# Patient Record
Sex: Male | Born: 1955 | Race: White | Hispanic: No | Marital: Single | Smoking: Former smoker
Health system: Southern US, Community
[De-identification: ages and names within clinical notes are randomized; demographics above are authoritative.]

## PROBLEM LIST (undated history)

## (undated) DIAGNOSIS — E785 Hyperlipidemia, unspecified: Secondary | ICD-10-CM

## (undated) DIAGNOSIS — J9819 Other pulmonary collapse: Secondary | ICD-10-CM

## (undated) DIAGNOSIS — I2510 Atherosclerotic heart disease of native coronary artery without angina pectoris: Secondary | ICD-10-CM

## (undated) DIAGNOSIS — K5790 Diverticulosis of intestine, part unspecified, without perforation or abscess without bleeding: Secondary | ICD-10-CM

## (undated) DIAGNOSIS — M549 Dorsalgia, unspecified: Secondary | ICD-10-CM

## (undated) DIAGNOSIS — I10 Essential (primary) hypertension: Secondary | ICD-10-CM

## (undated) DIAGNOSIS — F32A Depression, unspecified: Secondary | ICD-10-CM

## (undated) DIAGNOSIS — G8929 Other chronic pain: Secondary | ICD-10-CM

## (undated) DIAGNOSIS — J449 Chronic obstructive pulmonary disease, unspecified: Secondary | ICD-10-CM

## (undated) DIAGNOSIS — F329 Major depressive disorder, single episode, unspecified: Secondary | ICD-10-CM

## (undated) HISTORY — DX: Hyperlipidemia, unspecified: E78.5

## (undated) HISTORY — PX: OTHER SURGICAL HISTORY: SHX169

## (undated) HISTORY — DX: Other chronic pain: G89.29

## (undated) HISTORY — DX: Depression, unspecified: F32.A

## (undated) HISTORY — DX: Essential (primary) hypertension: I10

## (undated) HISTORY — DX: Dorsalgia, unspecified: M54.9

## (undated) HISTORY — PX: CARDIAC CATHETERIZATION: SHX172

## (undated) HISTORY — PX: CORONARY STENT PLACEMENT: SHX1402

## (undated) HISTORY — DX: Other pulmonary collapse: J98.19

## (undated) HISTORY — DX: Major depressive disorder, single episode, unspecified: F32.9

## (undated) HISTORY — DX: Chronic obstructive pulmonary disease, unspecified: J44.9

## (undated) HISTORY — DX: Atherosclerotic heart disease of native coronary artery without angina pectoris: I25.10

---

## 1997-09-14 ENCOUNTER — Emergency Department (HOSPITAL_COMMUNITY): Admission: EM | Admit: 1997-09-14 | Discharge: 1997-09-14 | Payer: Self-pay | Admitting: Internal Medicine

## 2004-01-08 ENCOUNTER — Ambulatory Visit (HOSPITAL_COMMUNITY): Admission: RE | Admit: 2004-01-08 | Discharge: 2004-01-08 | Payer: Self-pay | Admitting: Occupational Therapy

## 2004-02-20 ENCOUNTER — Ambulatory Visit (HOSPITAL_COMMUNITY): Admission: RE | Admit: 2004-02-20 | Discharge: 2004-02-20 | Payer: Self-pay | Admitting: Occupational Therapy

## 2005-06-09 ENCOUNTER — Emergency Department (HOSPITAL_COMMUNITY): Admission: EM | Admit: 2005-06-09 | Discharge: 2005-06-09 | Payer: Self-pay | Admitting: Emergency Medicine

## 2006-08-06 ENCOUNTER — Emergency Department (HOSPITAL_COMMUNITY): Admission: EM | Admit: 2006-08-06 | Discharge: 2006-08-06 | Payer: Self-pay | Admitting: Emergency Medicine

## 2007-03-09 ENCOUNTER — Ambulatory Visit: Payer: Self-pay | Admitting: Cardiology

## 2007-03-10 ENCOUNTER — Ambulatory Visit: Payer: Self-pay | Admitting: Cardiology

## 2007-03-15 ENCOUNTER — Ambulatory Visit: Payer: Self-pay | Admitting: Internal Medicine

## 2007-03-15 ENCOUNTER — Inpatient Hospital Stay (HOSPITAL_BASED_OUTPATIENT_CLINIC_OR_DEPARTMENT_OTHER): Admission: RE | Admit: 2007-03-15 | Discharge: 2007-03-15 | Payer: Self-pay | Admitting: Internal Medicine

## 2007-03-24 ENCOUNTER — Ambulatory Visit: Payer: Self-pay | Admitting: Cardiology

## 2007-10-27 ENCOUNTER — Ambulatory Visit: Payer: Self-pay | Admitting: Internal Medicine

## 2008-05-29 ENCOUNTER — Encounter: Admission: RE | Admit: 2008-05-29 | Discharge: 2008-05-29 | Payer: Self-pay | Admitting: Internal Medicine

## 2010-02-03 ENCOUNTER — Ambulatory Visit: Payer: Self-pay | Admitting: Physician Assistant

## 2010-02-03 ENCOUNTER — Telehealth: Payer: Self-pay | Admitting: Physician Assistant

## 2010-02-03 ENCOUNTER — Encounter: Payer: Self-pay | Admitting: Physician Assistant

## 2010-02-03 DIAGNOSIS — E785 Hyperlipidemia, unspecified: Secondary | ICD-10-CM

## 2010-02-03 DIAGNOSIS — F17200 Nicotine dependence, unspecified, uncomplicated: Secondary | ICD-10-CM

## 2010-02-03 DIAGNOSIS — J449 Chronic obstructive pulmonary disease, unspecified: Secondary | ICD-10-CM

## 2010-02-03 DIAGNOSIS — I10 Essential (primary) hypertension: Secondary | ICD-10-CM

## 2010-02-03 DIAGNOSIS — I2510 Atherosclerotic heart disease of native coronary artery without angina pectoris: Secondary | ICD-10-CM | POA: Insufficient documentation

## 2010-02-03 DIAGNOSIS — R079 Chest pain, unspecified: Secondary | ICD-10-CM | POA: Insufficient documentation

## 2010-02-03 DIAGNOSIS — J4489 Other specified chronic obstructive pulmonary disease: Secondary | ICD-10-CM | POA: Insufficient documentation

## 2010-02-12 ENCOUNTER — Telehealth (INDEPENDENT_AMBULATORY_CARE_PROVIDER_SITE_OTHER): Payer: Self-pay | Admitting: Radiology

## 2010-02-13 ENCOUNTER — Encounter: Payer: Self-pay | Admitting: Cardiology

## 2010-02-13 ENCOUNTER — Ambulatory Visit: Payer: Self-pay | Admitting: Cardiology

## 2010-02-13 ENCOUNTER — Ambulatory Visit: Payer: Self-pay

## 2010-02-13 ENCOUNTER — Encounter (HOSPITAL_COMMUNITY)
Admission: RE | Admit: 2010-02-13 | Discharge: 2010-04-05 | Payer: Self-pay | Source: Home / Self Care | Attending: Internal Medicine | Admitting: Internal Medicine

## 2010-02-20 ENCOUNTER — Encounter: Payer: Self-pay | Admitting: Internal Medicine

## 2010-02-24 ENCOUNTER — Ambulatory Visit: Payer: Self-pay | Admitting: Internal Medicine

## 2010-03-03 ENCOUNTER — Ambulatory Visit: Payer: Self-pay | Admitting: Internal Medicine

## 2010-03-03 ENCOUNTER — Inpatient Hospital Stay (HOSPITAL_BASED_OUTPATIENT_CLINIC_OR_DEPARTMENT_OTHER): Admission: RE | Admit: 2010-03-03 | Discharge: 2010-03-03 | Payer: Self-pay | Admitting: Internal Medicine

## 2010-03-04 ENCOUNTER — Encounter: Payer: Self-pay | Admitting: Physician Assistant

## 2010-03-04 ENCOUNTER — Telehealth: Payer: Self-pay | Admitting: Cardiology

## 2010-03-05 ENCOUNTER — Ambulatory Visit (HOSPITAL_COMMUNITY)
Admission: RE | Admit: 2010-03-05 | Discharge: 2010-03-06 | Payer: Self-pay | Source: Home / Self Care | Admitting: Internal Medicine

## 2010-03-05 ENCOUNTER — Ambulatory Visit: Payer: Self-pay | Admitting: Cardiology

## 2010-03-05 LAB — CONVERTED CEMR LAB
BUN: 10 mg/dL (ref 6–23)
Basophils Absolute: 0.1 10*3/uL (ref 0.0–0.1)
Basophils Relative: 0.5 % (ref 0.0–3.0)
CO2: 25 meq/L (ref 19–32)
Calcium: 9.3 mg/dL (ref 8.4–10.5)
Chloride: 104 meq/L (ref 96–112)
Creatinine, Ser: 0.8 mg/dL (ref 0.4–1.5)
Eosinophils Absolute: 0.4 10*3/uL (ref 0.0–0.7)
Eosinophils Relative: 3.5 % (ref 0.0–5.0)
GFR calc non Af Amer: 104.39 mL/min (ref >60)
Glucose, Bld: 104 mg/dL — ABNORMAL HIGH (ref 70–99)
HCT: 47.5 % (ref 39.0–52.0)
Hemoglobin: 16.4 g/dL (ref 13.0–17.0)
INR: 1 (ref 0.8–1.0)
Lymphocytes Relative: 30.1 % (ref 12.0–46.0)
Lymphs Abs: 3.2 10*3/uL (ref 0.7–4.0)
MCHC: 34.5 g/dL (ref 30.0–36.0)
MCV: 90.7 fL (ref 78.0–100.0)
Monocytes Absolute: 0.6 10*3/uL (ref 0.1–1.0)
Monocytes Relative: 5.9 % (ref 3.0–12.0)
Neutro Abs: 6.3 10*3/uL (ref 1.4–7.7)
Neutrophils Relative %: 60 % (ref 43.0–77.0)
Platelets: 270 10*3/uL (ref 150.0–400.0)
Potassium: 4.4 meq/L (ref 3.5–5.1)
Prothrombin Time: 10.3 s (ref 9.7–11.8)
RBC: 5.24 M/uL (ref 4.22–5.81)
RDW: 14.7 % — ABNORMAL HIGH (ref 11.5–14.6)
Sodium: 140 meq/L (ref 135–145)
WBC: 10.5 10*3/uL (ref 4.5–10.5)

## 2010-03-20 ENCOUNTER — Ambulatory Visit: Payer: Self-pay

## 2010-03-20 ENCOUNTER — Encounter: Payer: Self-pay | Admitting: Physician Assistant

## 2010-04-22 ENCOUNTER — Encounter: Payer: Self-pay | Admitting: Physician Assistant

## 2010-04-22 ENCOUNTER — Ambulatory Visit
Admission: RE | Admit: 2010-04-22 | Discharge: 2010-04-22 | Payer: Self-pay | Source: Home / Self Care | Attending: Physician Assistant | Admitting: Physician Assistant

## 2010-04-22 ENCOUNTER — Other Ambulatory Visit: Payer: Self-pay | Admitting: Physician Assistant

## 2010-04-22 DIAGNOSIS — R109 Unspecified abdominal pain: Secondary | ICD-10-CM | POA: Insufficient documentation

## 2010-04-23 LAB — CBC WITH DIFFERENTIAL/PLATELET
Basophils Absolute: 0.1 10*3/uL (ref 0.0–0.1)
Basophils Relative: 0.6 % (ref 0.0–3.0)
Eosinophils Absolute: 0.3 10*3/uL (ref 0.0–0.7)
Eosinophils Relative: 3.3 % (ref 0.0–5.0)
HCT: 47.3 % (ref 39.0–52.0)
Hemoglobin: 16.3 g/dL (ref 13.0–17.0)
Lymphocytes Relative: 33.8 % (ref 12.0–46.0)
Lymphs Abs: 3.4 10*3/uL (ref 0.7–4.0)
MCHC: 34.4 g/dL (ref 30.0–36.0)
MCV: 91 fl (ref 78.0–100.0)
Monocytes Absolute: 0.5 10*3/uL (ref 0.1–1.0)
Monocytes Relative: 4.8 % (ref 3.0–12.0)
Neutro Abs: 5.8 10*3/uL (ref 1.4–7.7)
Neutrophils Relative %: 57.5 % (ref 43.0–77.0)
Platelets: 278 10*3/uL (ref 150.0–400.0)
RBC: 5.2 Mil/uL (ref 4.22–5.81)
RDW: 14.1 % (ref 11.5–14.6)
WBC: 10.1 10*3/uL (ref 4.5–10.5)

## 2010-04-23 LAB — BASIC METABOLIC PANEL
BUN: 10 mg/dL (ref 6–23)
CO2: 27 mEq/L (ref 19–32)
Calcium: 9.5 mg/dL (ref 8.4–10.5)
Chloride: 104 mEq/L (ref 96–112)
Creatinine, Ser: 0.8 mg/dL (ref 0.4–1.5)
GFR: 113.89 mL/min (ref >60.00)
Glucose, Bld: 87 mg/dL (ref 70–99)
Potassium: 4.3 mEq/L (ref 3.5–5.1)
Sodium: 139 mEq/L (ref 135–145)

## 2010-05-06 NOTE — Assessment & Plan Note (Signed)
Summary: Cardiology Nuclear Testing  Nuclear Med Background Indications for Stress Test: Evaluation for Ischemia   History: COPD, Heart Catheterization   Symptoms: Chest Pressure, Chest Pressure with Exertion, Diaphoresis, Dizziness, DOE, Fatigue, Nausea, SOB  Symptoms Comments: CP>jaw. Last episode of CP:one month ago.   Nuclear Pre-Procedure Cardiac Risk Factors: Family History - CAD, Hypertension, Lipids, Obesity, Smoker Caffeine/Decaff Intake: None NPO After: 8:00 PM Lungs: Inspiratory and expiratory wheezes throughout, O2 Sat 96% on RA.  ProAir inhaler used prior to infusion. IV 0.9% NS with Angio Cath: 20g     IV Site: L Wrist IV Started by: Stanton Kidney, EMT-P Chest Size (in) 50     Height (in): 66 Weight (lb): 209 BMI: 33.86  Nuclear Med Study 1 or 2 day study:  1 day     Stress Test Type:  Dobutamine Reading MD:  Olga Millers, MD     Referring MD:  Arvilla Meres, MD Resting Radionuclide:  Technetium 97m Tetrofosmin     Resting Radionuclide Dose:  11 mCi  Stress Radionuclide:  Technetium 50m Tetrofosmin     Stress Radionuclide Dose:  33 mCi   Stress Protocol Exercise Time (min):  13:00 min     Max HR:  148 bpm     Predicted Max HR:  167 bpm  Max Systolic BP: 168 mm Hg     Percent Max HR:  88.62 %Rate Pressure Product:  16109   Dose of Dobutamine:  40 mcg/kg/min (at max HR)  Stress Test Technologist:  Rea [employer], CMA-N     Nuclear Technologist:  Doyne Keel, CNMT  Rest Procedure  Myocardial perfusion imaging was performed at rest 45 minutes following the intravenous administration of Technetium 3m Tetrofosmin.  Stress Procedure  The patient received IV dobutamine and no IV atropine. There were no significant changes with infusion, other than frequent PVC's with occasional couplets. He did c/o chest tightness with infusion.  Technetium 22m Tetrofosmin was injected at peak heart rate and quantitative spect images were obtained after a 45 minute  delay.  QPS Raw Data Images:  Acquisition technically good; normal left ventricular size. Stress Images:  There is decreased uptake in the inferior wall Rest Images:  There is decreased uptake in the inferior wall, less prominent compared to the stress images. 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 Transient Ischemic Dilatation:  1.11  (Normal <1.22)  Lung/Heart Ratio:  0.40  (Normal <0.45)  Quantitative Gated Spect Images QGS EDV:  95 ml QGS ESV:  43 ml QGS EF:  55 % QGS cine images:  Normal wall motion.   Overall Impression  Exercise Capacity: Dobutamine study with no exercise. BP Response: Normal blood pressure response. Clinical Symptoms: There is chest pain ECG Impression: No significant ST segment change suggestive of ischemia. 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  Appended Document: Cardiology Nuclear Testing Lorin Picket - this may be diaphragmatic attenuation. lets review images next week to decide if cath is warranted. -dan  Appended Document: Cardiology Nuclear Testing Dr Gala Romney reviewed images, pt needs cath, have called pt and Left message to call back   Appended Document: Cardiology Nuclear Testing pt is aware of results, he is checking on transportation and will call me back to sch cath, gave pt 2 day to choose from 11/23 or 11/28  Appended Document: Cardiology Nuclear Testing spoke w/pt cath sch for 11/28 at 9:30, instructions reviewed w/him  over phone, he will come for labs on Mon 11/21   Clinical Lists Changes  Orders: Added new Referral order of Cardiac Catheterization (Cardiac Cath) - Signed

## 2010-05-06 NOTE — Progress Notes (Signed)
Summary: needs order   Phone Note From Other Clinic   Caller: cone Summary of Call: pt having intervascular [place] tomorrow with brodie-they need orders fax [phone] Initial call taken by: Glynda Jaeger,  March 04, 2010 1:39 PM  Follow-up for Phone Call        I spoke with Trish since Dr. Gala Romney scheduled this at d/c from the [facility] on 11/28. Per Otis Dials should be writing the orders since this was set up from the [facility].  Follow-up by: Sherri Rad, RN, BSN,  March 04, 2010 1:59 PM
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
## 2010-05-06 NOTE — Letter (Signed)
Summary: Cardiac Catheterization Instructions- [facility]  [facility], Main Office  [address]   [place], [place] [address]   Phone: [phone]  Fax: [phone]     02/20/2010 MRN: [id number]  [facility] [address] [place], [place]  [address]  Dear Mr. Medeiros,   You are scheduled for a Cardiac Catheterization on Monday 03/03/10 with Dr. Gala Romney  Please arrive to the 1st floor of the Heart and Vascular Center at [facility] at 8:30 am / pm on the day of your procedure. Please do not arrive before 6:30 a.m. Call the Heart and Vascular Center at [phone] if you are unable to make your appointmnet. The Code to get into the parking garage under the building is 2000. Take the elevators to the 1st floor. You must have someone to drive you home. Someone must be with you for the first 24 hours after you arrive home. Please wear clothes that are easy to get on and off and wear slip-on shoes. Do not eat or drink after midnight except water with your medications that morning. Bring all your medications and current insurance cards with you.  ___ DO NOT take these medications before your procedure: ________________________________________________________________  _X__ Make sure you take your aspirin.  _X__ You may take ALL of your medications with water that morning. ________________________________________________________________________________________________________________________________  ___ DO NOT take ANY medications before your procedure.  ___ Pre-med instructions:  ________________________________________________________________________________________________________________________________  The usual length of stay after your procedure is 2 to 3 hours. This can vary.  If you have any questions, please call the office at the number listed above.   Meredith Staggers, RN

## 2010-05-06 NOTE — Progress Notes (Signed)
   Phone Note Outgoing Call   Summary of Call: [place]  Please call the patient and tell him to stop the Diltiazem. I noticed he is on this as well.  This medicine is the same as amlodipine.  He does not need both.  I want him to stay on the Amlodipine.  Initial call taken by: Brynda Rim,  February 03, 2010 5:06 PM  Follow-up for Phone Call        [facility]. Ollen Gross, RN, BSN  February 04, 2010 8:12 AM Pt. called back. He is aware to stop take Diltiazem 30 mg. Pt. verbalized understanding Follow-up by: Ollen Gross, RN, BSN,  February 04, 2010 8:22 AM

## 2010-05-06 NOTE — Miscellaneous (Signed)
  Clinical Lists Changes  Observations: Added new observation of CARDCATHFIND: ASSESSMENT: 1. Coronary artery disease with borderline lesion in the mid-to-distal     AV groove left circumflex. 2. Normal left ventricular function.   PLAN/DISCUSSION:  This is a difficult situation in looking at his coronary anatomy, his lesion in the left circumflex does not appear overly tight; however, it is mildly hazy.  Given his symptoms and positive stress test, I think he warrants further evaluation.  I have discussed with Dr. Juanda Chance.  We will plan to bring him back for an intervascular ultrasound on Wednesday.  We will start him on Plavix.   (03/03/2010 10:16) Added new observation of NUCLEAR NOS: Exercise Capacity: Dobutamine study with no exercise. BP Response: Normal blood pressure response. Clinical Symptoms: There is chest pain ECG Impression: No significant ST segment change suggestive of ischemia. 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 (02/13/2010 10:16)      Cardiac Cath  Procedure date:  03/03/2010  Findings:      ASSESSMENT: 1. Coronary artery disease with borderline lesion in the mid-to-distal     AV groove left circumflex. 2. Normal left ventricular function.   PLAN/DISCUSSION:  This is a difficult situation in looking at his coronary anatomy, his lesion in the left circumflex does not appear overly tight; however, it is mildly hazy.  Given his symptoms and positive stress test, I think he warrants further evaluation.  I have discussed with Dr. Juanda Chance.  We will plan to bring him back for an intervascular ultrasound on Wednesday.  We will start him on Plavix.    Nuclear Study  Procedure date:  02/13/2010  Findings:      Exercise Capacity: Dobutamine study with no exercise. BP Response: Normal blood pressure response. Clinical Symptoms: There is chest pain ECG Impression: No  significant ST segment change suggestive of ischemia. 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

## 2010-05-06 NOTE — Progress Notes (Signed)
Summary: nuc pre-procedure  Phone Note Outgoing Call   Call placed by: Domenic Polite, CNMT,  February 12, 2010 10:02 AM Call placed to: Patient Reason for Call: Confirm/change Appt Summary of Call: Reviewed information on Myoview Information Sheet (see scanned document for further details).  Spoke with patient.  Initial call taken by: Domenic Polite, CNMT,  February 12, 2010 10:02 AM     Nuclear Med Background Indications for Stress Test: Evaluation for Ischemia   History: COPD, Heart Catheterization   Symptoms: Chest Pain, Chest Pressure with Exertion, Diaphoresis, DOE, Nausea  Symptoms Comments: CP>>> Leg/Arm   Nuclear Pre-Procedure Cardiac Risk Factors: Family History - CAD, Hypertension, Lipids, Smoker Height (in): 66

## 2010-05-08 NOTE — Miscellaneous (Signed)
  Clinical Lists Changes  Observations: Added new observation of CARDCATHFIND: CONCLUSION:  Successful intracoronary vascular ultrasound-guided percutaneous coronary intervention of the lesion in the distal circumflex artery with improvement in central narrowing from 80% to 0%.   DISPOSITION:  The patient returned to post angio room for further observation.  We will work on smoking cessation.   (03/05/2010 8:02)      Cardiac Cath  Procedure date:  03/05/2010  Findings:      CONCLUSION:  Successful intracoronary vascular ultrasound-guided percutaneous coronary intervention of the lesion in the distal circumflex artery with improvement in central narrowing from 80% to 0%.   DISPOSITION:  The patient returned to post angio room for further observation.  We will work on smoking cessation.

## 2010-05-08 NOTE — Assessment & Plan Note (Addendum)
Summary: eph.gd  Medications Added ASPIRIN 325 MG TABS (ASPIRIN) 1 once daily MELOXICAM 15 MG TABS (MELOXICAM) AS NEEDED PLAVIX 75 MG TABS (CLOPIDOGREL BISULFATE) 1 once daily PEPCID 20 MG TABS (FAMOTIDINE) Take 1 tablet by mouth two times a day        Visit Type:  Follow-up Primary Provider:  Dr Selena Batten  CC:  PHOSP.  History of Present Illness: Primary Cardiologist:  Dr. Arvilla Meres   [name] [name] is a 55 year old male with a history of tobacco abuse as well as hypertension and hyperlipidemia.  Cardiac catheterization by Dr. Gala Romney in December 2008 showed mild nonobstructive coronary artery disease with an EF of 60%.  I saw him recently for chest discomfort.  A dobutamine Myoview study was abnormal and he was set up for cardiac catheterization.  This was done on November 28 and demonstrated a mid 60-70% circumflex lesion.  He was brought back to the cardiac catheterization lab for IVUS guided intervention.  He had drug-eluting stent placed to the circumflex.  He tolerated the procedure well and was to followup several weeks ago.  He is just returning now for followup.  The patient states that he was admitted to [facility] mid-December for abdominal pain, nausea and vomiting.  He was not really placed on any new medications or had any tests.  He has not yet seen his primary care provider.  Since he was placed on Plavix he has had increased abdominal discomfort and indigestion symptoms.  He denies hematemesis.  He denies melena.  He has had some loose stools.  He denies any further chest discomfort.  He has chronic dyspnea with exertion.  This is related to his COPD. This is unchanged.  He denies orthopnea or syncope.  Current Medications (verified): 1)  Amlodipine Besylate 10 Mg Tabs (Amlodipine Besylate) .... Take One Tablet By Mouth Daily 2)  Lisinopril 40 Mg Tabs (Lisinopril) .... Take One Tablet By Mouth Daily 3)  Diltiazem Hcl 30 Mg Tabs (Diltiazem Hcl) .... Take 1  Tablet By Mouth Once A Day 4)  Proair Hfa 108 (90 Base) Mcg/act Aers (Albuterol Sulfate) .... As Needed 5)  Cyclobenzaprine Hcl 10 Mg Tabs (Cyclobenzaprine Hcl) .... As Needed 6)  Aspirin 325 Mg Tabs (Aspirin) .Marland Kitchen.. 1 Once Daily 7)  Pristiq 100 Mg Xr24h-Tab (Desvenlafaxine Succinate) .... Once Daily 8)  Simvastatin 20 Mg Tabs (Simvastatin) .... Take One Tablet By Mouth Daily At Bedtime 9)  Nitrostat 0.4 Mg Subl (Nitroglycerin) .Marland Kitchen.. 1 Tablet Under Tongue At Onset of Chest Pain; You May Repeat Every 5 Minutes For Up To 3 Doses. 10)  Spiriva Handihaler 18 Mcg Caps (Tiotropium Bromide Monohydrate) .... Once Daily 11)  Meloxicam 15 Mg Tabs (Meloxicam) .... As Needed 12)  Plavix 75 Mg Tabs (Clopidogrel Bisulfate) .Marland Kitchen.. 1 Once Daily  Allergies: No Known Drug Allergies  Past History:  Past Medical History: Last updated: 03/07/2010 CAD    a.  cath 2008:  LAD 40% mid and ostial D2 50%; CFx and RCA ok; EF 60%   b.  abnl. myoview . . .cath 03/03/2010:  mLAD 30%; pCFX 20%; mid-distal CFX 70-80%   c.  IVUS guided PCI of mid-distal CFX 03/03/2010 (Promus DES) Hypertension COPD Hyperlipidemia Chronic Back Pain Depression  Review of Systems       As per  the HPI.  All other systems reviewed and negative.  Vital Signs:  Patient profile:   55 year old male Height:      66 inches Weight:  210.25 pounds BMI:     34.06 Pulse rate:   82 / minute Pulse rhythm:   regular BP sitting:   110 / 68  (left arm) Cuff size:   regular  Vitals Entered By: Scherrie Bateman, LPN (April 22, 2010 3:28 PM)  Physical Exam  General:  Well nourished, well developed, in no acute distress HEENT: normal Neck: no JVD Cardiac:  normal S1, S2; RRR; no murmur Lungs:  decreased breath sounds bilat with exp wheezing in all fields Abd: soft, nontender, no hepatomegaly Ext: no  edema; RFA site without hematoma or bruit Vascular: no carotid  bruits Skin: warm and dry Endo: no thyromegaly   EKG  Procedure  date:  04/22/2010  Findings:      normal sinus rhythm Heart rate 82 Normal axis Low-voltage Poor R-wave progression Nonspecific ST-T wave changes  Impression & Recommendations:  Problem # 1:  CAD (ICD-414.00) No further anginal symptoms.  Continue on aspirin and Plavix.  Please see below.  Orders: EKG w/ Interpretation & report only (93010)  Problem # 2:  HYPERLIPIDEMIA (ICD-272.4) This is followed by his PCP.  His goal LDL is less than 70. His updated medication list for this problem includes:    Simvastatin 20 Mg Tabs (Simvastatin) .Marland Kitchen... Take one tablet by mouth daily at bedtime  Problem # 3:  ESSENTIAL HYPERTENSION, BENIGN (ICD-401.1) Controlled.  I am uncertain as to why he is on diltiazem 30 mg a day.  With the use of amlodipine and simvastatin, I have asked him to go ahead and stop this. Orders: TLB-BMP (Basic Metabolic Panel-BMET) (80048-METABOL) TLB-CBC Platelet - w/Differential (85025-CBCD)  Problem # 4:  COPD (ICD-496) I have spent less than 5 minutes counseling him on smoking cessation today.  I've asked him to follow up with his primary care provider for strategies.  He should also call [facility] (he lives in [place]) to see if they have any classes that he can attend.  Problem # 5:  ABDOMINAL PAIN OTHER SPECIFIED SITE (ICD-789.09) I think he may be experiencing some GI upset with the addition of Plavix.  His aspirin will be decreased to 81 mg a day.  He needs to remain on Plavix for at least a year.  I have placed him on Pepcid 20 mg twice a day.  I will check a CBC and basic metabolic panel today.  He should follow up wis primary care physician for further evaluation if needed.  Patient Instructions: 1)  Your physician has recommended you make the following change in your medication:  2)  STOP taking Diltiazem. 3)  Decrease ASA to 81 mg once daily. 4)  Start taking Pepcid 20 mg two times a day for your stomach. 5)  Your physician recommends  that you schedule a follow-up appointment in: 2 MONTHS WITH DR BENSIMHON 6)  FOLLOW UP WITH PRIMARY MD FOR COPD AND STOMACH Prescriptions: PEPCID 20 MG TABS (FAMOTIDINE) Take 1 tablet by mouth two times a day  #60 x 3   Entered and Authorized by:   Tereso Newcomer PA-C   Signed by:   Tereso Newcomer PA-C on 04/22/2010   Method used:   Print then Give to Patient   RxID:   [id number]

## 2010-05-08 NOTE — Cardiovascular Report (Signed)
Summary: Pre Cath Orders   Pre Cath Orders   Imported By: Roderic Ovens 03/14/2010 14:47:31  _____________________________________________________________________  External Attachment:    Type:   Image     Comment:   External Document

## 2010-05-15 ENCOUNTER — Telehealth: Payer: Self-pay | Admitting: Internal Medicine

## 2010-05-22 ENCOUNTER — Telehealth: Payer: Self-pay | Admitting: Internal Medicine

## 2010-05-22 NOTE — Progress Notes (Signed)
Summary: medication question   Phone Note Call from Patient Call back at Home Phone [phone]   Caller: Patient Summary of Call: Pt have question about meidcation Symbicord  Initial call taken by: Judie Grieve,  May 15, 2010 11:44 AM  Follow-up for Phone Call        Pt pcp prescribed symbicort and pt has read adverse reactions. Also needed 2 month appt with Dr. Gala Romney -- Appt. made for 3/5/12He wants to make sure it is okay with Dr. Gala Romney to take this medication.  I will forward to Dr. Gala Romney for review. Follow-up by: Lisabeth Devoid RN,  May 15, 2010 12:00 PM     Appended Document: medication question ok from cardiac standpoint.   Appended Document: medication question Left message to call back  Appended Document: medication question   lm to cb pt aware

## 2010-06-03 NOTE — Progress Notes (Signed)
Summary: pt has knot he is concerned   Phone Note Call from Patient Call back at Home Phone [phone]   Caller: Patient Reason for Call: Talk to Nurse, Talk to Doctor Summary of Call: per pt call on his left side at the bottom of his rib cage he has a egg shaped knot and it is sore and bothering him it has been about a month and he is concerned  Initial call taken by: Omer Jack,  May 22, 2010 12:36 PM  Follow-up for Phone Call        left message for pt to call back if he feels like this is related to something cardiac however sounds as if this needs to be evaluated by his primary care MD. Follow-up by: Charolotte Capuchin, RN,  May 22, 2010 2:47 PM  Additional Follow-up for Phone Call Additional follow up Details #1::        pt has never called back, left mess make sure pt had f/u w/pcp if he needed [place] to give [place] a call back Meredith Staggers, RN  May 26, 2010 3:29 PM

## 2010-06-09 ENCOUNTER — Encounter: Payer: Self-pay | Admitting: Internal Medicine

## 2010-06-09 ENCOUNTER — Ambulatory Visit (INDEPENDENT_AMBULATORY_CARE_PROVIDER_SITE_OTHER): Payer: Medicare Other | Admitting: Internal Medicine

## 2010-06-09 DIAGNOSIS — J4489 Other specified chronic obstructive pulmonary disease: Secondary | ICD-10-CM

## 2010-06-09 DIAGNOSIS — J449 Chronic obstructive pulmonary disease, unspecified: Secondary | ICD-10-CM

## 2010-06-09 DIAGNOSIS — I2510 Atherosclerotic heart disease of native coronary artery without angina pectoris: Secondary | ICD-10-CM

## 2010-06-17 LAB — CBC
HCT: 42 % (ref 39.0–52.0)
Hemoglobin: 14 g/dL (ref 13.0–17.0)
MCHC: 33.3 g/dL (ref 30.0–36.0)
MCV: 90.2 fL (ref 78.0–100.0)
Platelets: 236 10*3/uL (ref 150–400)
RBC: 4.67 MIL/uL (ref 4.22–5.81)
RBC: 5.32 MIL/uL (ref 4.22–5.81)
RDW: 14 % (ref 11.5–15.5)
WBC: 8.5 10*3/uL (ref 4.0–10.5)
WBC: 10.3 10*3/uL (ref 4.0–10.5)

## 2010-06-17 LAB — BASIC METABOLIC PANEL
BUN: 9 mg/dL (ref 6–23)
Calcium: 8.4 mg/dL (ref 8.4–10.5)
Calcium: 9.5 mg/dL (ref 8.4–10.5)
Chloride: 104 mEq/L (ref 96–112)
Creatinine, Ser: 0.73 mg/dL (ref 0.4–1.5)
GFR calc Af Amer: >60 mL/min (ref >60)
GFR calc Af Amer: >60 mL/min (ref >60)
GFR calc non Af Amer: >60 mL/min (ref >60)
Potassium: 3.8 mEq/L (ref 3.5–5.1)
Sodium: 136 mEq/L (ref 135–145)

## 2010-06-17 NOTE — Assessment & Plan Note (Signed)
Summary: follow-up/dfg  Medications Added ASPIRIN 81 MG TBEC (ASPIRIN) Take one tablet by mouth daily TRAMADOL HCL 50 MG TABS (TRAMADOL HCL) as needed ALBUTEROL SULFATE (2.5 MG/3ML) 0.083% NEBU (ALBUTEROL SULFATE) as needed      Allergies Added: NKDA  Visit Type:  Follow-up Primary Provider:  Dr Selena Batten  CC:  Chest pains- Sob.  History of Present Illness: [name] [name] is a 55 year old male with a history of COPD with ongoing tobacco abuse as well as hypertension and hyperlipidemia.  Cardiac catheterization  in December 2008 showed mild nonobstructive coronary artery disease with an EF of 60%. He was seen by [name] [name] recently for chest discomfort.  A dobutamine Myoview study was abnormal and he was set up for cardiac catheterization.  This was done on November 28 and demonstrated a mid 60-70% circumflex lesion.  He was brought back to the cardiac catheterization lab for IVUS guided intervention.  He had drug-eluting stent placed to the circumflex.  He tolerated the procedure well. He saw [name] [name] back in January and was doing well without angina.  Returns today for f/u.  Overall doing well. Tired frequently. About 2-3 weeks ago, went to [facility] region to have an u/s to evaluate knot in his left side. While there developed a dry mouth and then had some pleutiric CP and SOB. Went to ER. Got NTG and cardiac markers and ECG which were OK.   Has not had any CP since. On disability. Able to do ADLs. Still smoking 1ppd. Taking Plavix every day. Not going to cardiac rehab. Recently coughing and wheezing. Doesn't know if he snores or not.   Current Medications (verified): 1)  Amlodipine Besylate 10 Mg Tabs (Amlodipine Besylate) .... Take One Tablet By Mouth Daily 2)  Lisinopril 40 Mg Tabs (Lisinopril) .... Take One Tablet By Mouth Daily 3)  Proair Hfa 108 (90 Base) Mcg/act Aers (Albuterol Sulfate) .... As Needed 4)  Cyclobenzaprine Hcl 10 Mg Tabs (Cyclobenzaprine Hcl) .... As Needed 5)   Aspirin 325 Mg Tabs (Aspirin) .Marland Kitchen.. 1 Once Daily 6)  Pristiq 100 Mg Xr24h-Tab (Desvenlafaxine Succinate) .... Once Daily 7)  Simvastatin 20 Mg Tabs (Simvastatin) .... Take One Tablet By Mouth Daily At Bedtime 8)  Nitrostat 0.4 Mg Subl (Nitroglycerin) .Marland Kitchen.. 1 Tablet Under Tongue At Onset of Chest Pain; You May Repeat Every 5 Minutes For Up To 3 Doses. 9)  Spiriva Handihaler 18 Mcg Caps (Tiotropium Bromide Monohydrate) .... Once Daily 10)  Plavix 75 Mg Tabs (Clopidogrel Bisulfate) .Marland Kitchen.. 1 Once Daily 11)  Pepcid 20 Mg Tabs (Famotidine) .... Take 1 Tablet By Mouth Two Times A Day 12)  Aspirin 81 Mg Tbec (Aspirin) .... Take One Tablet By Mouth Daily 13)  Tramadol Hcl 50 Mg Tabs (Tramadol Hcl) .... As Needed 14)  Albuterol Sulfate (2.5 Mg/74ml) 0.083% Nebu (Albuterol Sulfate) .... As Needed  Allergies (verified): No Known Drug Allergies  Past History:  Past Medical History: Last updated: 03/07/2010 CAD    a.  cath 2008:  LAD 40% mid and ostial D2 50%; CFx and RCA ok; EF 60%   b.  abnl. myoview . . .cath 03/03/2010:  mLAD 30%; pCFX 20%; mid-distal CFX 70-80%   c.  IVUS guided PCI of mid-distal CFX 03/03/2010 (Promus DES) Hypertension COPD Hyperlipidemia Chronic Back Pain Depression  Review of Systems       As per HPI and past medical history; otherwise all systems negative.   Vital Signs:  Patient profile:   55 year old male Height:  66 inches Weight:      213.25 pounds BMI:     34.54 Pulse rate:   86 / minute Pulse rhythm:   regular Resp:     20 per minute BP sitting:   118 / 80  (left arm) Cuff size:   large  Vitals Entered By: Vikki Ports (June 09, 2010 1:49 PM)  Physical Exam  General:  Well nourished, well developed, in no acute distress. Long hair HEENT: normal Neck: no JVD. Carotids 2+ bilateral no bruit Cardiac:  normal S1, S2; RRR; no murmur Lungs: markedly decreased breath sounds bilat. no wheezing Abd: soft, nontender, no hepatomegaly Ext: no   edema; Skin: warm and dry    Impression & Recommendations:  Problem # 1:  CAD (ICD-414.00) Stable s/p PCI. Continue current meds. No b-blocked due to severe COPD. Will refer to cardiac rehab in [facility].   Problem # 2:  COPD (ICD-496) Counseled on need to stop smoking. Refer to pulmonary for optimization of regimen and possible sleep study.   Patient Instructions: 1)  We recommend you see a pulmonologist, call [facility] in [place] [phone] 2)  Your physician wants you to follow-up in:  1 year.  You will receive a reminder letter in the mail two months in advance. If you don't receive a letter, please call our office to schedule the follow-up appointment.

## 2010-07-10 ENCOUNTER — Telehealth: Payer: Self-pay | Admitting: Internal Medicine

## 2010-07-10 NOTE — Telephone Encounter (Signed)
Pt is calling re refferal to lung doctor. Pt wants to talk to a nurse.

## 2010-07-10 NOTE — Telephone Encounter (Signed)
Spoke w/pt we had given him number to a pulmonologist in [facility] and he lost it, gave him # to [employer]

## 2010-08-19 NOTE — Assessment & Plan Note (Signed)
[facility]                          [facility] OFFICE NOTE   NAME:[name] [name] MONCUS                       MRN:          [id number]  DATE:03/24/2007                            DOB:          June 02, 1955    INDICATIONS:  Patient is a 55 year old male with multiple cardiac risk  factors.  The patient underwent cardiac catheterization and was found to  have nonobstructive coronary artery disease.  The patient denies any  recurrent substernal chest pain.  His blood pressure is under better  control on his current medical regimen.   MEDICATIONS:  1. Naprosyn p.r.n.  2. ProAir.  3. Aspirin 81 mg p.o. daily.  4. Imdur.  5. Lisinopril 10 mg daily.  6. Clonidine 0.1 daily.  7. Simvastatin 20 mg p.o. q.h.s.   PHYSICAL EXAMINATION:  VITAL SIGNS:  Blood pressure 153/97, heart rate  74, weight 216 pounds.  NECK EXAM:  Normal carotid upstroke, no carotid bruits.  LUNGS:  Clear breath sounds bilaterally.  HEART:  Regular rate and rhythm with normal S1, S2.  ABDOMEN:  Soft.  EXTREMITY EXAM:  No cyanosis, clubbing or edema.  NEUROLOGIC:  Patient alert and oriented, grossly nonfocal.  The right groin has a small hematoma, but no fluctuance and no pulsatile  mass, no bruit.   PROBLEMS:  1. Nonobstructive coronary artery disease.  2. Atypical chest pain without recurrence.  3. Hypertension, poorly controlled.  4. Tobacco use.  5. Multiple cardiac risk factors.   PLAN:  1. Patient was instructed to discontinue tobacco.  He will need      significant risk factor modification despite the fact that he has      nonobstructive disease.  2. The patient's lisinopril is being increased from 10 to 20 mg p.o.      daily for better blood      pressure control.  He will also follow up with [place] and may need the      addition of hydrochlorothiazide to his blood pressure regimen.     Learta Codding, MD,FACC  Electronically Signed    GED/MedQ  DD: 03/24/2007  DT:  03/25/2007  Job #: [id number]   cc:   [name] Peri, MD

## 2010-08-19 NOTE — Cardiovascular Report (Signed)
NAME:  [name] [name], [name] [name] NO.:  [id number]   MEDICAL RECORD NO.:  [id number]          PATIENT TYPE:  OIB   LOCATION:  1963                         FACILITY:  [facility]   PHYSICIAN:  Bevelyn Buckles. Bensimhon, MDDATE OF BIRTH:  10-30-1955   DATE OF PROCEDURE:  03/15/2007  DATE OF DISCHARGE:                            CARDIAC CATHETERIZATION   PRIMARY CARE PHYSICIAN:  Dr. Kirstie Peri.   CARDIOLOGIST:  Dr. Lewayne Bunting.   PATIENT INDICATIONS:  Mr. Novosad is a 55 year old male with multiple  cardiac risk factors who has been having left-sided chest pain.  He is  referred for cardiac catheterization in the outpatient catheterization  laboratory.   PROCEDURES PERFORMED:  1. Left heart cath.  2. Left ventriculogram.  3. Selective coronary angiography.   DESCRIPTION OF PROCEDURE:  The risks and indication of the procedure  were explained.  Consent was signed, placed in the chart.  The right  groin area was prepped and draped in routine sterile fashion,  anesthetized 1% local lidocaine.  A 4 French arterial sheath was placed  in the right femoral artery using a modified Seldinger technique.  Standard catheters, including a JL-4, 3-DRC, and angled pigtail were  used for procedure.  All catheter exchanges made over a wire.  There  were no apparent complications.   Central aortic pressure was 144/95 with a mean of 115.  LV pressure is  150/11 with an EDP of 19.  There was no aortic stenosis.   Left main was normal.   LAD coursed the apex, gave off a small proximal diagonal and a moderate-  sized mid diagonal.  There was a 40% lesion in the mid LAD at the  takeoff of the second diagonal.  There was a 50% ostial lesion in the  second diagonal.   Left circumflex gave off three small obtuse marginals and a large fourth  marginal.  There was a small posterolateral.  There were mild luminal  irregularities in the proximal and distal left circumflex.   Right coronary artery was a  large dominant vessel.  It gave off an RV  branch, a large PDA and two posterolaterals, angiographically normal.   Left ventriculogram done in the RAO position showed an EF of 60% with no  wall motion abnormalities.  On panning down over the abdominal aorta  after the ventriculogram, there was no evidence of aneurysmal  dilatation.   ASSESSMENT:  1. Minimal nonobstructive coronary artery disease as described above.  2. Normal left ventricular function with mildly elevated left      ventricular end-diastolic pressures suggestive of diastolic      dysfunction.   PLAN/DISCUSSION:  Given his catheterization findings, I doubt his chest  pain is cardiac.  Would continue risk factor management.      Bevelyn Buckles. Bensimhon, MD  Electronically Signed     DRB/MEDQ  D:  03/15/2007  T:  03/15/2007  Job:  [id number]
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
## 2010-08-19 NOTE — Assessment & Plan Note (Signed)
[facility]                          [facility]ICE NOTE   NAME:Sens, DAVID RODRIQUEZ                       MRN:          [id number]  DATE:03/24/2007                            DOB:          [birth year]    THERE IS NO DICTATION FOR THIS JOB NUMBER.     Learta Codding, MD,FACC  Electronically Signed    GED/MedQ  DD: 03/24/2007  DT: 03/25/2007  Job #: [id number]

## 2010-08-19 NOTE — Assessment & Plan Note (Signed)
[facility]                            CARDIOLOGY OFFICE NOTE   NAME:Buzzelli, DECLIN RAJAN                       MRN:          [id number]  DATE:10/27/2007                            DOB:          July 27, 1955    PRIMARY CARE PHYSICIAN:  Massie Maroon, MD.   INTERVAL HISTORY:  Mr. Wray is a 55 year old male with a history of  tobacco abuse as well as hypertension.  He was previously followed by  Dr. Andee Lineman.  He had an episode of chest pain in the past and underwent  cardiac catheterization by me in December 2008, which showed mild  nonobstructive coronary artery disease with an EF of 60%.   He presents today for initial followup with me.  He as I said previously  was followed with Dr. Andee Lineman, but now he is moving.  He would liked to  be followed in [place].   He denies any chest pain or significant dyspnea.  His main complaint is  that he has episodes where he feels somewhat dizzy sometimes these  happen when standing up, but other times they can happen when he is not  doing anything, he has not passed out.  He does sometimes feel the room  is spinning.   Unfortunately, he continues to smoke 1 pack of cigarettes a day.   CURRENT MEDICATIONS:  1. Simvastatin 20 mg a day, which he is out of.  2. Lisinopril 20 a day.  3. Naproxen 500 mg p.r.n.  4. Imdur 30 a day.  5. Clonidine 0.1 mg daily.  6. Goody's Powder 2 tablets at night that help him sleep.  7. Advair Diskus.   PHYSICAL EXAMINATION:  GENERAL:  He is in no acute distress, ambulatory  in the clinic without any respiratory difficulty.  VITAL SIGNS:  Blood pressure is 132/80, heart rate is 78, weight is 196.  Orthostatics, sitting blood pressure is 124/84 and when standing blood  pressure remains unchanged.  HEENT:  Normal.  NECK:  Supple.  No JVD.  Carotid are 2+ bilaterally without any bruits.  There is no lymphadenopathy or thyromegaly.  CARDIAC:  He has distant heart sounds.  PMI is not  palpable.  He has  regular rate and rhythm.  No murmurs, rubs, or gallops.  LUNGS:  Mildly  decreased breath sounds throughout.  No wheezing or rales.  There is a  prolonged expiratory phase.  ABDOMEN:  Obese, nontender, and nondistended.  No hepatosplenomegaly, no  bruits, and no masses.  Good bowel sounds.  EXTREMITIES:  Warm with no  cyanosis, clubbing, or edema.  No rash.  NEURO:  Alert and oriented x3.  Cranial nerves II-XII are intact.  Moves  all 4 without difficulty.  Affect is pleasant.   LABORATORY DATA:  EKG shows sinus rhythm at a rate of 78 with mild T-  wave flattening.   ASSESSMENT AND PLAN:  1. Chest pain.  He has got minimal coronary artery disease by      catheterization.  We can go ahead and stop his Imdur.  He will need  aggressive risk factor management.  2. Dizziness.  This does sound like it may be orthostatic in nature,      but he is now orthostatic here.  He did not appear to be having any      arrhythmias.  His daughter will monitor him more closely with a      blood pressure cuff.  He will follow up with Dr. Selena Batten.  3. Tobacco use.  I had a long talk with him about the need to stop      smoking.   DISPOSITION:  We can see him back on a p.r.n. basis.     Bevelyn Buckles. Bensimhon, MD  Electronically Signed    DRB/MedQ  DD: 10/27/2007  DT: 10/28/2007  Job #: [id number]   cc:   Massie Maroon, MD

## 2010-09-09 LAB — PULMONARY FUNCTION TEST

## 2010-10-07 ENCOUNTER — Other Ambulatory Visit: Payer: Self-pay | Admitting: Physician Assistant

## 2010-10-09 ENCOUNTER — Other Ambulatory Visit: Payer: Self-pay | Admitting: *Deleted

## 2010-10-09 MED ORDER — SIMVASTATIN 20 MG PO TABS: 20.0000 mg | ORAL_TABLET | Freq: Every day | ORAL | Status: DC

## 2010-12-24 ENCOUNTER — Telehealth: Payer: Self-pay | Admitting: Internal Medicine

## 2010-12-24 NOTE — Telephone Encounter (Signed)
Spoke with pt who reports pain in leg that is worse with movement. Not continuous and goes away on own. Describes as tingling that goes up leg and has areas on bottom of feet that bother him.  I asked him to contact primary MD for this.  Also states he has had 2 episodes of neck and jaw pain in past week. Pain went away on own. He states that it feels like prior to his stent.  Will review with Dr. Ladona Ridgel (office DOD)

## 2010-12-24 NOTE — Telephone Encounter (Signed)
Pt is having pain and pressure in his jaw and neck and he has tingling in his leg and not sleeping well and he wants to talk to someone about it

## 2010-12-24 NOTE — Telephone Encounter (Signed)
[name] [name], [name] has opening on his schedule and can see pt tomorrow at 11:00. I called and gave pt this information and he will be here for appt.  He is having no neck, jaw or chest pain at present time. I told him if he were to have increasing symptoms prior to appt he should go to ER to be evaluated.

## 2010-12-24 NOTE — Telephone Encounter (Signed)
Agree with followup with Mr. Alben Spittle.

## 2010-12-25 ENCOUNTER — Ambulatory Visit (INDEPENDENT_AMBULATORY_CARE_PROVIDER_SITE_OTHER): Payer: Medicare Other | Admitting: Physician Assistant

## 2010-12-25 ENCOUNTER — Encounter: Payer: Self-pay | Admitting: *Deleted

## 2010-12-25 ENCOUNTER — Encounter: Payer: Self-pay | Admitting: Physician Assistant

## 2010-12-25 DIAGNOSIS — I208 Other forms of angina pectoris: Secondary | ICD-10-CM

## 2010-12-25 DIAGNOSIS — I2089 Other forms of angina pectoris: Secondary | ICD-10-CM | POA: Insufficient documentation

## 2010-12-25 DIAGNOSIS — I10 Essential (primary) hypertension: Secondary | ICD-10-CM

## 2010-12-25 DIAGNOSIS — F17200 Nicotine dependence, unspecified, uncomplicated: Secondary | ICD-10-CM

## 2010-12-25 DIAGNOSIS — I209 Angina pectoris, unspecified: Secondary | ICD-10-CM

## 2010-12-25 DIAGNOSIS — E785 Hyperlipidemia, unspecified: Secondary | ICD-10-CM

## 2010-12-25 DIAGNOSIS — I2510 Atherosclerotic heart disease of native coronary artery without angina pectoris: Secondary | ICD-10-CM

## 2010-12-25 LAB — CBC WITH DIFFERENTIAL/PLATELET
Basophils Absolute: 0.1 10*3/uL (ref 0.0–0.1)
Eosinophils Relative: 3.5 % (ref 0.0–5.0)
Hemoglobin: 16.9 g/dL (ref 13.0–17.0)
Lymphocytes Relative: 29.1 % (ref 12.0–46.0)
Monocytes Relative: 6 % (ref 3.0–12.0)
Platelets: 260 10*3/uL (ref 150.0–400.0)
RDW: 15.1 % — ABNORMAL HIGH (ref 11.5–14.6)
WBC: 9.6 10*3/uL (ref 4.5–10.5)

## 2010-12-25 LAB — BASIC METABOLIC PANEL
BUN: 9 mg/dL (ref 6–23)
Calcium: 9.3 mg/dL (ref 8.4–10.5)
GFR: 193.05 mL/min (ref >60.00)
Glucose, Bld: 125 mg/dL — ABNORMAL HIGH (ref 70–99)
Potassium: 4.3 mEq/L (ref 3.5–5.1)
Sodium: 137 mEq/L (ref 135–145)

## 2010-12-25 LAB — PROTIME-INR: Prothrombin Time: 10.8 s (ref 10.2–12.4)

## 2010-12-25 MED ORDER — NITROGLYCERIN 0.4 MG SL SUBL: 0.4000 mg | SUBLINGUAL_TABLET | SUBLINGUAL | Status: DC | PRN

## 2010-12-25 NOTE — Assessment & Plan Note (Signed)
Suspect jaw pain is anginal equivalent.  Proceed to cath as noted.

## 2010-12-25 NOTE — Assessment & Plan Note (Signed)
He knows he needs to quit. 

## 2010-12-25 NOTE — Progress Notes (Signed)
History of Present Illness: Primary Cardiologist:  Dr. Arvilla Meres  [name] [name] is a 55 y.o. male with a history of tobacco abuse as well as hypertension and hyperlipidemia. Cardiac catheterization by Dr. Gala Romney in December 2008 showed mild nonobstructive coronary artery disease with an EF of 60%.  He had a dobutamine Myoview study for chest pain in 01/2010 and this was abnormal and he was set up for cardiac catheterization. This was done 11/11 and demonstrated a mid 60-70% circumflex lesion. He was brought back to the cardiac catheterization lab for IVUS guided intervention. He had drug-eluting stent placed to the circumflex.  He was last seen by Dr. Gala Romney in 3/12.    He presents today with jaw pain.  This occurred twice last week.  He is not that active.  On disability with his back and has severe COPD.  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  It lasted a couple minutes.  He then had more jaw pain with walking across the parking lot at [facility].  He felt short of breath.  No nausea or diaphoresis.  No syncope.  Felt lightheaded some recently.  States jaw pain just like what he had prior to PCI.  Denies recent chest pain.  He is compliant with all his meds.  Past Medical History  Diagnosis Date  . CAD (coronary artery disease)     a. cath 2008: mLAD 40%, oD2 50%, CFX and RCA ok, EF 60%;   b.  cath 11/11: mLAD 30%, pCFX 20%, m-d CFX 70-80%  -  m-dCFX tx with Promus DES (using IVUS)  . HTN (hypertension)   . HLD (hyperlipidemia)   . COPD (chronic obstructive pulmonary disease)   . Chronic back pain   . Depression     Current Outpatient Prescriptions  Medication Sig Dispense Refill  . amLODipine (NORVASC) 10 MG tablet TAKE ONE TABLET BY MOUTH EVERY DAY  90 tablet  3  . aspirin 81 MG tablet Take 81 mg by mouth daily.        . clopidogrel (PLAVIX) 75 MG tablet Take 75 mg by mouth daily.        . cyclobenzaprine (FLEXERIL) 10 MG tablet Take 10 mg by mouth 3  (three) times daily as needed.        Marland Kitchen lisinopril (PRINIVIL,ZESTRIL) 40 MG tablet Take 40 mg by mouth daily.        . simvastatin (ZOCOR) 20 MG tablet Take 1 tablet (20 mg total) by mouth at bedtime.  30 tablet  6  . nitroGLYCERIN (NITROSTAT) 0.4 MG SL tablet Place 1 tablet (0.4 mg total) under the tongue every 5 (five) minutes as needed for chest pain.  25 tablet  3    Allergies: No Known Allergies  Social Hx:  Still smoking cigarettes  ROS:  Please see the history of present illness.  Has decreased urine stream ongoing for 4 months.  Sees his PCP soon for CPE.  Has had some epigastric pain recently.  Also had some painful nodules on his legs recently that went away on their own.  Otherwise, all other systems reviewed and negative.   Vital Signs: BP 116/77  Pulse 80  Ht 5\' 8"  (1.727 m)  Wt 210 lb (95.255 kg)  BMI 31.93 kg/m2  PHYSICAL EXAM: Well nourished, well developed, in no acute distress HEENT: normal Neck: no JVD Endocrine: no thyromegaly Cardiac:  normal S1, S2; RRR; no murmur Lungs:  Decreased breath sounds bilat., exp  wheezes throughout Abd: soft, nontender, no hepatomegaly Ext: no edema Skin: warm and dry Neuro:  CNs 2-12 intact, no focal abnormalities noted Psych: normal affect  EKG:  NSR, HR 80, LAD, low voltage, NSSTTW changes  ASSESSMENT AND PLAN:

## 2010-12-25 NOTE — Assessment & Plan Note (Addendum)
He has had some exertional jaw pain recently like his previous angina.  Has not had this since his PCI.  Still smoking.  We discussed various options for evaluation including stress testing vs. Cath.  I prefer cath and he agrees to this.  Discussed with Dr. Riley Kill.  Risks and benefits of cardiac catheterization have been discussed with the patient.  These include bleeding, infection, kidney damage, stroke, heart attack, death.  The patient understands these risks and is willing to proceed.  Continue ASA and Plavix.  He knows to go to the ED if he feels worse.

## 2010-12-25 NOTE — Assessment & Plan Note (Signed)
Continue simvastatin. 

## 2010-12-25 NOTE — Patient Instructions (Signed)
Your physician has requested that you have a cardiac catheterization DX 786.50 CHEST PAIN  THIS WILL BE DONE 12/26/10 WITH DR. Riley Kill. Cardiac catheterization is used to diagnose and/or treat various heart conditions. Doctors may recommend this procedure for a number of different reasons. The most common reason is to evaluate chest pain. Chest pain can be a symptom of coronary artery disease (CAD), and cardiac catheterization can show whether plaque is narrowing or blocking your heart's arteries. This procedure is also used to evaluate the valves, as well as measure the blood flow and oxygen levels in different parts of your heart. For further information please visit [email or web address] Please follow instruction sheet, as given.  Your physician recommends that you return for lab work in: TODAY STAT PRE CATH LABS PT/INR, CBC W/DIFF, BMET CATH TOMORROW 12/26/10 WITH DR. Riley Kill

## 2010-12-25 NOTE — Assessment & Plan Note (Signed)
Controlled.  

## 2010-12-26 ENCOUNTER — Ambulatory Visit (HOSPITAL_COMMUNITY)
Admission: RE | Admit: 2010-12-26 | Discharge: 2010-12-27 | Disposition: A | Discharge status: Home / Self Care | Payer: Medicare Other | Source: Ambulatory Visit | Attending: Cardiology | Admitting: Cardiology

## 2010-12-26 DIAGNOSIS — M549 Dorsalgia, unspecified: Secondary | ICD-10-CM | POA: Insufficient documentation

## 2010-12-26 DIAGNOSIS — I2510 Atherosclerotic heart disease of native coronary artery without angina pectoris: Secondary | ICD-10-CM | POA: Insufficient documentation

## 2010-12-26 DIAGNOSIS — J4489 Other specified chronic obstructive pulmonary disease: Secondary | ICD-10-CM | POA: Insufficient documentation

## 2010-12-26 DIAGNOSIS — I10 Essential (primary) hypertension: Secondary | ICD-10-CM | POA: Insufficient documentation

## 2010-12-26 DIAGNOSIS — R51 Headache: Secondary | ICD-10-CM | POA: Insufficient documentation

## 2010-12-26 DIAGNOSIS — Z23 Encounter for immunization: Secondary | ICD-10-CM | POA: Insufficient documentation

## 2010-12-26 DIAGNOSIS — G8929 Other chronic pain: Secondary | ICD-10-CM | POA: Insufficient documentation

## 2010-12-26 DIAGNOSIS — Z9861 Coronary angioplasty status: Secondary | ICD-10-CM | POA: Insufficient documentation

## 2010-12-26 DIAGNOSIS — E785 Hyperlipidemia, unspecified: Secondary | ICD-10-CM | POA: Insufficient documentation

## 2010-12-26 DIAGNOSIS — F17200 Nicotine dependence, unspecified, uncomplicated: Secondary | ICD-10-CM | POA: Insufficient documentation

## 2010-12-26 DIAGNOSIS — F3289 Other specified depressive episodes: Secondary | ICD-10-CM | POA: Insufficient documentation

## 2010-12-26 DIAGNOSIS — F329 Major depressive disorder, single episode, unspecified: Secondary | ICD-10-CM | POA: Insufficient documentation

## 2010-12-26 DIAGNOSIS — J449 Chronic obstructive pulmonary disease, unspecified: Secondary | ICD-10-CM | POA: Insufficient documentation

## 2010-12-27 ENCOUNTER — Ambulatory Visit (HOSPITAL_COMMUNITY): Payer: Medicare Other

## 2010-12-27 DIAGNOSIS — R079 Chest pain, unspecified: Secondary | ICD-10-CM

## 2010-12-27 LAB — BASIC METABOLIC PANEL
BUN: 10 mg/dL (ref 6–23)
CO2: 26 mEq/L (ref 19–32)
Chloride: 104 mEq/L (ref 96–112)
Glucose, Bld: 118 mg/dL — ABNORMAL HIGH (ref 70–99)
Potassium: 3.8 mEq/L (ref 3.5–5.1)

## 2010-12-27 LAB — CBC
HCT: 44.9 % (ref 39.0–52.0)
Hemoglobin: 15.2 g/dL (ref 13.0–17.0)
MCV: 88.2 fL (ref 78.0–100.0)
WBC: 8.7 10*3/uL (ref 4.0–10.5)

## 2011-01-05 ENCOUNTER — Encounter: Payer: Medicare Other | Admitting: Physician Assistant

## 2011-01-13 ENCOUNTER — Encounter: Payer: Self-pay | Admitting: Physician Assistant

## 2011-01-13 ENCOUNTER — Ambulatory Visit (INDEPENDENT_AMBULATORY_CARE_PROVIDER_SITE_OTHER): Payer: Medicare Other | Admitting: Physician Assistant

## 2011-01-13 VITALS — BP 112/75 | HR 84 | Ht 65.0 in | Wt 216.0 lb

## 2011-01-13 DIAGNOSIS — F17200 Nicotine dependence, unspecified, uncomplicated: Secondary | ICD-10-CM

## 2011-01-13 DIAGNOSIS — E785 Hyperlipidemia, unspecified: Secondary | ICD-10-CM

## 2011-01-13 DIAGNOSIS — J449 Chronic obstructive pulmonary disease, unspecified: Secondary | ICD-10-CM

## 2011-01-13 DIAGNOSIS — J4489 Other specified chronic obstructive pulmonary disease: Secondary | ICD-10-CM

## 2011-01-13 DIAGNOSIS — I2510 Atherosclerotic heart disease of native coronary artery without angina pectoris: Secondary | ICD-10-CM

## 2011-01-13 NOTE — Assessment & Plan Note (Signed)
We discussed calling 1-800-QUIT NOW.  We also discussed shifting money from cigarette purchases to purchasing nicotine patches.  He knows he needs to quit.

## 2011-01-13 NOTE — Assessment & Plan Note (Signed)
He has significant symptoms from COPD.  Tobacco cessation discussed.  See below.  Refer to pulmonology for assistance in management.

## 2011-01-13 NOTE — Progress Notes (Signed)
History of Present Illness: Primary Cardiologist:  Dr. Arvilla Meres  [name] [name] is a 55 y.o. male with a history of CAD, HTN, hyperlipidemia and tobacco abuse. Cath in December 2008 showed mild nonobstructive coronary artery disease.  Dobutamine Myoview study for chest pain in 01/2010 was abnormal and cath demonstrated a mid 60-70% circumflex lesion that was treated with a drug-eluting stent.  I saw him a couple weeks ago with jaw pain.  It was like what he had prior to PCI.  We set him up for cardiac cath.  This was done by Dr. Riley Kill 9/21: LM 50% decreased to 20% with IC NTG (? spasm 2/2 nicotine), oLAd 40%, mLAD 30%, D2 50%, CFX stent ok, RCA ok, LVF normal - medical therapy continued.  He was placed on Imdur.  Tobacco cessation was strongly urged.  Labs: Hgb 15.2, K 3.8, creatinine 0.54, CXR with lateral LLL linear scar vs ATX.    Doing ok.  No more jaw pain. He has significant DOE.  Probably Class 3.  Also, wheezes a lot.  Wants to quit smoking.  Limited income.  Wants to know if program exists to get him free nicotine patches.  Right wrist feels ok.  No orthopnea, PND or significant edema.  No syncope.  No chest pain.  Tolerating Imdur ok.  Has some headaches.  Past Medical History  Diagnosis Date  . CAD (coronary artery disease)     a. cath 2008: mLAD 40%, oD2 50%, CFX and RCA ok, EF 60%;   b.  cath 11/11: mLAD 30%, pCFX 20%, m-d CFX 70-80%  -  m-dCFX tx with Promus DES (using IVUS);  cath 9/12: LM 50% down to 20% with IC NTG (? spasm 2/2 nicotine), oLAd 40%, mLAD 30%, D2 50%, CFX stent ok, RCA ok, LVF normal - medical therapy (Imdur started)  . HTN (hypertension)   . HLD (hyperlipidemia)   . COPD (chronic obstructive pulmonary disease)   . Chronic back pain   . Depression     Current Outpatient Prescriptions  Medication Sig Dispense Refill  . albuterol (PROVENTIL HFA;VENTOLIN HFA) 108 (90 BASE) MCG/ACT inhaler Inhale 2 puffs into the lungs every 6 (six) hours as needed.        Marland Kitchen  amLODipine (NORVASC) 10 MG tablet TAKE ONE TABLET BY MOUTH EVERY DAY  90 tablet  3  . aspirin 81 MG tablet Take 81 mg by mouth daily.        . clopidogrel (PLAVIX) 75 MG tablet Take 75 mg by mouth daily.        . cyclobenzaprine (FLEXERIL) 10 MG tablet Take 10 mg by mouth 3 (three) times daily as needed.        . isosorbide mononitrate (IMDUR) 30 MG 24 hr tablet Take 30 mg by mouth daily.        Marland Kitchen lisinopril (PRINIVIL,ZESTRIL) 40 MG tablet Take 40 mg by mouth daily.        . nitroGLYCERIN (NITROSTAT) 0.4 MG SL tablet Place 1 tablet (0.4 mg total) under the tongue every 5 (five) minutes as needed for chest pain.  25 tablet  3  . simvastatin (ZOCOR) 20 MG tablet Take 1 tablet (20 mg total) by mouth at bedtime.  30 tablet  6  . tiotropium (SPIRIVA) 18 MCG inhalation capsule Place 18 mcg into inhaler and inhale daily.        . traMADol (ULTRAM) 50 MG tablet Take 50 mg by mouth every 8 (eight) hours as needed.  Allergies: No Known Allergies  Social Hx:  Still smoking cigarettes  Vital Signs: BP 112/75  Pulse 84  Ht 5\' 5"  (1.651 m)  Wt 216 lb (97.977 kg)  BMI 35.94 kg/m2  PHYSICAL EXAM: Well nourished, well developed, in no acute distress HEENT: normal Neck: no JVD Cardiac:  normal S1, S2; RRR; no murmur Lungs:  Decreased breath sounds bilat., exp wheezes/rhonchi throughout Abd: soft, nontender, no hepatomegaly Ext: no edema; right wrist without hematoma or bruit Skin: warm and dry Neuro:  CNs 2-12 intact, no focal abnormalities noted Psych: normal affect  EKG:  NSR, HR 88, LAD, low voltage, NSSTTW changes  ASSESSMENT AND PLAN:

## 2011-01-13 NOTE — Assessment & Plan Note (Signed)
As noted, stable anatomy on cath.  Has some evidence of spasm and is doing better with Isosorbide.  Continue ASA and Plavix and statin.  Follow up with Dr. Gala Romney 6 months.

## 2011-01-13 NOTE — Assessment & Plan Note (Signed)
Arrange follow up Lipids and LFTs.

## 2011-01-13 NOTE — Patient Instructions (Addendum)
Call 1-800-QUIT-NOW ([phone]) for help with quitting smoking.  Your physician recommends that you schedule a follow-up appointment in: 6 months with Dr Gala Romney Your physician recommends that you return for lab work in: a month You have been referred to Pulmonary for COPD

## 2011-01-15 NOTE — Cardiovascular Report (Signed)
NAMEMarland Kitchen  MAXIMINO, COZZOLINO NO.:  [id number]  MEDICAL RECORD NO.:  [id number]  LOCATION:  2504                         FACILITY:  [facility]  PHYSICIAN:  Arturo Morton. Riley Kill, MD, FACCDATE OF BIRTH:  Mar 30, 1956  DATE OF PROCEDURE:  12/26/2010 DATE OF DISCHARGE:                           CARDIAC CATHETERIZATION   INDICATIONS:  Mr. [name] [name] is a 55 year old and previously underwent stenting of the circumflex.  He has had some recurrent discomfort including jaw discomfort.  He unfortunately had stopped smoking but has resumed this.  Current study was done to assess coronary anatomy.  PROCEDURE: 1. Left heart catheterization. 2. Selective coronary arteriography. 3. Selective left ventriculography.  DESCRIPTION OF PROCEDURE:  The procedure was performed from the right radial artery.  A 6-French sheath was placed.  Intra-arterial verapamil 3 mg and 4500 units of intravenous heparin were administered.  We were able to gain access to the right coronary artery although the conus branch prevented deep intubation despite a number of different catheters.  We were able to see the vessel well with a guiding catheter and there was excellent opacification.  No significant obstruction was noted.  With engagement of the left main, there was about 50% narrowing of the left main.  Nitroglycerin was then administered into the aorta and repeat views obtained which showed the majority of this was relieved.  The stent site remained widely patent.  Central aortic and left ventricular pressures were then measured with pigtail and ventriculography was performed in the RAO projection.  There were no major complications.  TR band was placed and he was taken to the holding area in satisfactory clinical condition.  HEMODYNAMIC DATA: 1. Central aortic pressure 102/67, mean 84. 2. LV pressure 99/15. 3. No gradient or pullback across the aortic valve.  ANGIOGRAPHIC DATA: 1. The left main coronary  artery demonstrates some diffuse narrowing     just after the injection initially.  Following administration of     nitroglycerin, this opens up widely, and is less than 20% narrow. 2. The ostium of the left anterior descending artery has been     previously noted to be diseased.  There is about 40% narrowing.     The mid LAD has about 30% narrowing after the takeoff of the second     diagonal and second diagonal has about 50% ostial narrowing, both     of these involve bifurcation.  The distal LAD is without critical     narrowing. 3. The circumflex is a large-caliber vessel.  There is a previously     placed stent in the midvessel.  The circumflex appears widely     patent. 4. The right coronary artery is a large-caliber vessel in the     posterior descending and a posterolateral branch both of which are     free of critical disease. 5. Ventriculogram in the RAO projection reveals vigorous global     systolic function without segmental wall motion abnormality.  CONCLUSIONS: 1. Preserved left ventricular function. 2. Continued patency of the previously placed stent. 3. Proximal spasm at the catheter site.  DISPOSITION:  The patient absolutely must stop smoking.  We will add long-acting nitrates to  his regimen.  He will see Dr. Gala Romney back in followup.     Arturo Morton. Riley Kill, MD, [facility]     TDS/MEDQ  D:  12/26/2010  T:  12/27/2010  Job:  [id number]  cc:   CV Laboratory Bevelyn Buckles. Bensimhon, MD  Electronically Signed by Shawnie Pons MD [facility] on 01/15/2011 05:43:16 AM

## 2011-02-04 ENCOUNTER — Telehealth: Payer: Self-pay | Admitting: *Deleted

## 2011-02-04 DIAGNOSIS — E785 Hyperlipidemia, unspecified: Secondary | ICD-10-CM

## 2011-02-04 NOTE — Telephone Encounter (Signed)
Muscles are sore and joints are painful.... Also has bladder pain. I told him he might need to see primary but I would let Debbie know.

## 2011-02-04 NOTE — Telephone Encounter (Signed)
[name] [name] states his entire body is tender to the touch and his muscles ache x 1 month.  He states he even aches when he is just laying down.   He is concerned it might be a reaction to one of his meds.

## 2011-02-04 NOTE — Telephone Encounter (Signed)
He can hold Simvastatin for 2 weeks.  Have him come in for lab: Total CK (do not order CKMB) If pain resolves, let us know. If no relief, resume Simvastatin and follow up with PCP. Tereso Newcomer, PA-C

## 2011-02-05 NOTE — Discharge Summary (Signed)
  NAMEMarland Kitchen  KEVAUGHN, EWING NO.:  [id number]  MEDICAL RECORD NO.:  [id number]  LOCATION:  2504                         FACILITY:  [facility]  PHYSICIAN:  Hillis Range, MD       DATE OF BIRTH:  07-Oct-1955  DATE OF ADMISSION:  12/26/2010 DATE OF DISCHARGE:  12/27/2010                              DISCHARGE SUMMARY   PROCEDURES: 1. Cardiac catheterization. 2. Coronary arteriogram. 3. Left ventriculogram.  PRIMARY FINAL DISCHARGE DIAGNOSIS:  Jaw pain, possibly anginal equivalent.  SECONDARY DIAGNOSES: 1. Coronary artery disease, status post drug-eluting stent to the     circumflex in 2011. 2. Hypertension. 3. Hyperlipidemia. 4. Chronic obstructive pulmonary disease. 5. Tobacco use. 6. Chronic back pain. 7. Depression. 8. Status post cataract and clavicle surgery.  TIME AT DISCHARGE:  34 minutes.  HOSPITAL COURSE:  Mr. Zeck is a 55 year old male with a history of coronary artery disease.  He was seen in the office for possible angina and was scheduled for cardiac catheterization.  He came to the hospital for the procedure on December 26, 2010.  The cardiac catheterization showed a 50% left main secondary to spasm. This was treated with nitroglycerin and reduced to 20%.  The LAD had 30- 40% lesions.  The diagonal had a 50% lesion and there was less than 10% for in-stent restenosis in the circumflex.  His RCA was patent and his EF was normal.  Dr. Riley Kill felt that there was increased coronary vasoreactivity secondary to nicotine.  He had Imdur added to his medication regimen.  A chest x-ray was checked and showed lateral left lower lobe scarring or atelectasis, but no acute disease.  A smoking cessation consult was called and complete cessation was encouraged.  On December 27, 2010, Mr. Inclan was ambulating without chest pain or shortness of breath.  He had a minimal elevation in his blood sugars of 118 and 125, but he could follow up on this as an  outpatient.  He was evaluated by Dr. Johney Frame and considered stable for discharge, to follow up as an outpatient.  DISCHARGE INSTRUCTIONS: 1. His activity level is to be increased gradually. 2. He is not to lift for a week with his right arm. 3. He is to call our office for problems with the cath site. 4. He is to follow up with Tereso Newcomer or Dr. Gala Romney and we will     contact him with this appointment. 5. He is to follow up with Dr. Selena Batten as needed.  DISCHARGE MEDICATIONS: 1. Lisinopril 40 mg a day. 2. Spiriva daily. 3. Flexeril 10 mg t.i.d. p.r.n. 4. Amlodipine 10 mg a day. 5. Zocor 20 mg a day. 6. Sublingual nitroglycerin p.r.n. 7. Tramadol 50 mg q.8 h. p.r.n. 8. Aspirin 81 mg a day. 9. Isosorbide mononitrate 30 mg daily. 10.Plavix 75 mg a day. 11.ProAir q.4 h. p.r.n.     Theodore Demark, PA-C   ______________________________ Hillis Range, MD    RB/MEDQ  D:  12/27/2010  T:  12/27/2010  Job:  [id number]  Electronically Signed by Theodore Demark PA-C on 01/12/2011 06:46:07 AM Electronically Signed by Hillis Range MD on 02/05/2011 02:26:41 PM

## 2011-02-05 NOTE — Telephone Encounter (Signed)
N/A.  LMTC. 

## 2011-02-09 NOTE — Telephone Encounter (Signed)
Pt was notified.  

## 2011-02-09 NOTE — Telephone Encounter (Signed)
N/A.  LMTC. 

## 2011-02-13 ENCOUNTER — Other Ambulatory Visit: Payer: Self-pay | Admitting: Physician Assistant

## 2011-02-13 ENCOUNTER — Ambulatory Visit (INDEPENDENT_AMBULATORY_CARE_PROVIDER_SITE_OTHER): Payer: Medicare Other | Admitting: *Deleted

## 2011-02-13 DIAGNOSIS — E785 Hyperlipidemia, unspecified: Secondary | ICD-10-CM

## 2011-02-13 DIAGNOSIS — I2510 Atherosclerotic heart disease of native coronary artery without angina pectoris: Secondary | ICD-10-CM

## 2011-02-13 LAB — HEPATIC FUNCTION PANEL
Bilirubin, Direct: 0 mg/dL (ref 0.0–0.3)
Total Bilirubin: 0.5 mg/dL (ref 0.3–1.2)
Total Protein: 6.8 g/dL (ref 6.0–8.3)

## 2011-02-13 LAB — LIPID PANEL
HDL: 38.7 mg/dL — ABNORMAL LOW (ref >39.00)
Total CHOL/HDL Ratio: 4
VLDL: 50.2 mg/dL — ABNORMAL HIGH (ref 0.0–40.0)

## 2011-02-13 LAB — LDL CHOLESTEROL, DIRECT: Direct LDL: 114.7 mg/dL

## 2011-02-17 ENCOUNTER — Encounter: Payer: Self-pay | Admitting: *Deleted

## 2011-02-17 ENCOUNTER — Telehealth: Payer: Self-pay | Admitting: Internal Medicine

## 2011-02-17 MED ORDER — SIMVASTATIN 40 MG PO TABS: 40.0000 mg | ORAL_TABLET | Freq: Every day | ORAL | Status: DC

## 2011-02-17 MED ORDER — PRAVASTATIN SODIUM 40 MG PO TABS: 40.0000 mg | ORAL_TABLET | Freq: Every evening | ORAL | Status: DC

## 2011-02-17 NOTE — Telephone Encounter (Signed)
Stop simvastatin. Start Pravastatin 40 mg QHS. Check FLP and LFTs in 6 weeks Tereso Newcomer, PA-C

## 2011-02-17 NOTE — Telephone Encounter (Signed)
Pt states that holding the simvastatin helped his aching a little but he still has it.  It is mostly in his shoulders now.

## 2011-02-17 NOTE — Telephone Encounter (Signed)
Pt returning call from our office from yesterday . Pt said he believes call was from [place]. Please return pt call to discuss further.

## 2011-02-17 NOTE — Telephone Encounter (Signed)
NEW RX SENT IN FOR PRAVASTATIN 40 MG DAILY. Danielle Rankin

## 2011-02-20 ENCOUNTER — Other Ambulatory Visit: Payer: Self-pay

## 2011-02-20 ENCOUNTER — Telehealth: Payer: Self-pay | Admitting: Internal Medicine

## 2011-02-20 DIAGNOSIS — E785 Hyperlipidemia, unspecified: Secondary | ICD-10-CM

## 2011-02-20 NOTE — Telephone Encounter (Signed)
Pt calling to speak to nurse/md regarding pt taking and/or not taking pravastatin or simvastatin. Pt wants to know what medication he should be taking and at what time. Please return pt call to discuss further.

## 2011-03-09 ENCOUNTER — Other Ambulatory Visit: Payer: Self-pay | Admitting: Internal Medicine

## 2011-03-26 ENCOUNTER — Telehealth: Payer: Self-pay | Admitting: Internal Medicine

## 2011-03-26 NOTE — Telephone Encounter (Signed)
Pt calling has gotten his meds mixed up and needs to talk to nurse asap because he has not taken his morning doses yet

## 2011-03-26 NOTE — Telephone Encounter (Signed)
Pt states that he is having problems with acid reflux and that he was told by his PCP that he needed to have a colon done. Pt states that he has problems with transportation and that he really needs to be seen by a doctor in [facility]. Pt given the phone number for [facility] in [facility].

## 2011-03-26 NOTE — Telephone Encounter (Signed)
meds reviewed and was able to state all meds correctly, c/o chest pain with cough, denies n/v, pt has copd and smokes and is coughing a lot,  told to call pcp for f/u. Told to call back with further questions or concerns.

## 2011-04-03 ENCOUNTER — Other Ambulatory Visit (INDEPENDENT_AMBULATORY_CARE_PROVIDER_SITE_OTHER): Payer: Medicare Other | Admitting: *Deleted

## 2011-04-03 ENCOUNTER — Other Ambulatory Visit: Payer: Self-pay | Admitting: Physician Assistant

## 2011-04-03 DIAGNOSIS — E785 Hyperlipidemia, unspecified: Secondary | ICD-10-CM

## 2011-04-03 LAB — HEPATIC FUNCTION PANEL
AST: 30 U/L (ref 0–37)
Bilirubin, Direct: 0 mg/dL (ref 0.0–0.3)
Total Bilirubin: 0.4 mg/dL (ref 0.3–1.2)

## 2011-04-03 LAB — LIPID PANEL
Cholesterol: 157 mg/dL (ref 0–200)
Total CHOL/HDL Ratio: 4
VLDL: 77.4 mg/dL — ABNORMAL HIGH (ref 0.0–40.0)

## 2011-04-06 ENCOUNTER — Other Ambulatory Visit: Payer: Self-pay

## 2011-04-06 ENCOUNTER — Telehealth: Payer: Self-pay | Admitting: Physician Assistant

## 2011-04-06 DIAGNOSIS — E785 Hyperlipidemia, unspecified: Secondary | ICD-10-CM

## 2011-04-06 NOTE — Telephone Encounter (Signed)
See note on lab result 

## 2011-04-06 NOTE — Telephone Encounter (Signed)
Fu call °Pt returning your call  °

## 2011-04-16 ENCOUNTER — Telehealth: Payer: Self-pay | Admitting: Internal Medicine

## 2011-04-16 NOTE — Telephone Encounter (Signed)
Fu call Pt is calling back about this issue. He wants to know if he should stop plavix today. please let him know. He is stressed please call him back

## 2011-04-16 NOTE — Telephone Encounter (Signed)
F/U from previous call per Nurse Ester for Dr. Norma Fredrickson [phone], Fax  [phone], needs answer today regarding plavix surgery is on 1/18

## 2011-04-16 NOTE — Telephone Encounter (Signed)
Talked to pt and notified him that a message has been sent to Dr Gala Romney to find out about this.

## 2011-04-16 NOTE — Telephone Encounter (Signed)
New problem He is supposed to stop plavix today before procedure [facility] he has scheduled. please let him know

## 2011-04-16 NOTE — Telephone Encounter (Signed)
No chest pain per pt.

## 2011-04-16 NOTE — Telephone Encounter (Signed)
Dr Norma Fredrickson wants to know if Mr Hermann can stop his Plavix today.  He is scheduled for a colonoscopy and upper endoscopy on 04/24/11

## 2011-04-16 NOTE — Telephone Encounter (Signed)
agree

## 2011-04-16 NOTE — Telephone Encounter (Signed)
New generation DES put in 01/2010.  So ok to hold Plavix.  Restart post op when safe.  Continue ASA. Tereso Newcomer, PA-C  5:34 PM 04/16/2011

## 2011-04-16 NOTE — Telephone Encounter (Signed)
Pt states he is taking Aspirin 81mg  daily and was not told to stop.

## 2011-04-17 NOTE — Telephone Encounter (Signed)
Spoke to [employer] at Dr [employer] office. Informed them that approval for pt to stop Plavix faxed to them today.

## 2011-05-05 ENCOUNTER — Telehealth: Payer: Self-pay | Admitting: Internal Medicine

## 2011-05-05 NOTE — Telephone Encounter (Signed)
N/A.  LMTC. 

## 2011-05-05 NOTE — Telephone Encounter (Signed)
New Msg: Pt calling wanting to speak with MD about getting back on plavix following upper and lower GI procedure. Pt also wanted to inform MD that pt was recently dx with Type II diabetes and wanted to know how pt should proceed. Please discuss with pt also if pt care should continue to be advised per Dr. Gala Romney in [facility], or if pt care needs to be transferred to another MD.   Please return pt call to discuss further.

## 2011-05-08 NOTE — Telephone Encounter (Signed)
NA

## 2011-05-08 NOTE — Telephone Encounter (Signed)
Pt not available.  LMTC

## 2011-05-11 NOTE — Telephone Encounter (Signed)
NA

## 2011-05-14 NOTE — Telephone Encounter (Signed)
Pt states he is back on his plavix.

## 2011-05-26 ENCOUNTER — Telehealth: Payer: Self-pay | Admitting: Internal Medicine

## 2011-05-26 NOTE — Telephone Encounter (Signed)
N/A at pt number.  LMTC. 

## 2011-05-26 NOTE — Telephone Encounter (Signed)
Fu call °Pt returning your call  °

## 2011-06-22 ENCOUNTER — Encounter: Payer: Self-pay | Admitting: Gastroenterology

## 2011-07-06 ENCOUNTER — Other Ambulatory Visit (INDEPENDENT_AMBULATORY_CARE_PROVIDER_SITE_OTHER): Payer: Self-pay

## 2011-07-06 ENCOUNTER — Telehealth: Payer: Self-pay | Admitting: *Deleted

## 2011-07-06 DIAGNOSIS — E785 Hyperlipidemia, unspecified: Secondary | ICD-10-CM

## 2011-07-06 LAB — LIPID PANEL
Total CHOL/HDL Ratio: 3
Triglycerides: 108 mg/dL (ref 0.0–149.0)

## 2011-07-06 LAB — HEPATIC FUNCTION PANEL
AST: 22 U/L (ref 0–37)
Albumin: 4.3 g/dL (ref 3.5–5.2)
Alkaline Phosphatase: 101 U/L (ref 39–117)
Total Bilirubin: 0.1 mg/dL — ABNORMAL LOW (ref 0.3–1.2)

## 2011-07-06 NOTE — Telephone Encounter (Signed)
Message copied by Tarri Fuller on Mon Jul 06, 2011  4:49 PM ------      Message from: [place], [place] T      Created: Mon Jul 06, 2011  2:35 PM       Good      Tereso Newcomer, PA-C  2:35 PM 07/06/2011

## 2011-07-06 NOTE — Telephone Encounter (Signed)
lmom labs good. [name] [name]

## 2011-07-07 ENCOUNTER — Encounter: Payer: Self-pay | Admitting: *Deleted

## 2011-07-07 NOTE — Progress Notes (Signed)
Patient ID: [name] [name], male   DOB: March 12, 1956, 56 y.o.   MRN: [id number] Pt called to let us know that he has now been dx with DM and had a recent blood glucose of 900.  Danielle Rankin

## 2011-08-26 ENCOUNTER — Ambulatory Visit (INDEPENDENT_AMBULATORY_CARE_PROVIDER_SITE_OTHER): Payer: Medicare Other | Admitting: Cardiovascular Disease

## 2011-08-26 ENCOUNTER — Encounter: Payer: Self-pay | Admitting: Cardiovascular Disease

## 2011-08-26 VITALS — BP 115/75 | HR 84 | Ht 65.0 in | Wt 211.0 lb

## 2011-08-26 DIAGNOSIS — F17200 Nicotine dependence, unspecified, uncomplicated: Secondary | ICD-10-CM

## 2011-08-26 DIAGNOSIS — J449 Chronic obstructive pulmonary disease, unspecified: Secondary | ICD-10-CM

## 2011-08-26 DIAGNOSIS — I739 Peripheral vascular disease, unspecified: Secondary | ICD-10-CM

## 2011-08-26 DIAGNOSIS — R0602 Shortness of breath: Secondary | ICD-10-CM

## 2011-08-26 DIAGNOSIS — I2510 Atherosclerotic heart disease of native coronary artery without angina pectoris: Secondary | ICD-10-CM

## 2011-08-26 DIAGNOSIS — R0600 Dyspnea, unspecified: Secondary | ICD-10-CM

## 2011-08-26 NOTE — Assessment & Plan Note (Signed)
I discussed with him the importance of smoking cessation. He does not seem to be able to quit at this time.

## 2011-08-26 NOTE — Assessment & Plan Note (Signed)
His symptoms are overall atypical. Due to his prolonged history of smoking, I will obtain an ABI. His distal pulses are normal by exam.

## 2011-08-26 NOTE — Assessment & Plan Note (Addendum)
The patient has increased dyspnea lately which is likely due to COPD and continued smoking. However, I do recommend an echocardiogram to evaluate his diastolic function and pulmonary pressure. I will refer him for a pulmonary consultation given his worsening dyspnea.
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
## 2011-08-26 NOTE — Patient Instructions (Signed)
Your physician has requested that you have an echocardiogram. Echocardiography is a painless test that uses sound waves to create images of your heart. It provides your doctor with information about the size and shape of your heart and how well your heart's chambers and valves are working. This procedure takes approximately one hour. There are no restrictions for this procedure.  Your physician has requested that you have an ankle brachial index (ABI). During this test an ultrasound and blood pressure cuff are used to evaluate the arteries that supply the arms and legs with blood. Allow thirty minutes for this exam. There are no restrictions or special instructions.  You have been referred to Pulmonary for evaluation of COPD.  Your physician wants you to follow-up in: 6 MONTHS with Dr Kirke Corin. You will receive a reminder letter in the mail two months in advance. If you don't receive a letter, please call our office to schedule the follow-up appointment.  Your physician recommends that you continue on your current medications as directed. Please refer to the Current Medication list given to you today.

## 2011-08-26 NOTE — Assessment & Plan Note (Signed)
The patient has known history of coronary artery disease with previous drug-eluting stent to the mid left circumflex. He has chronic chest pain. He had one prolonged episode 2 weeks ago but none since then. I see no ischemic ECG changes today. He had a cardiac catheterization done in September which showed no obstructive disease. I recommend continuing medical therapy at this point.

## 2011-09-02 ENCOUNTER — Other Ambulatory Visit (HOSPITAL_COMMUNITY): Payer: Medicare Other

## 2011-09-02 ENCOUNTER — Institutional Professional Consult (permissible substitution): Payer: Medicare Other | Admitting: Pulmonary Disease

## 2011-09-16 ENCOUNTER — Telehealth: Payer: Self-pay | Admitting: *Deleted

## 2011-09-16 ENCOUNTER — Encounter (INDEPENDENT_AMBULATORY_CARE_PROVIDER_SITE_OTHER): Payer: Medicare Other

## 2011-09-16 DIAGNOSIS — I739 Peripheral vascular disease, unspecified: Secondary | ICD-10-CM

## 2011-09-16 NOTE — Telephone Encounter (Signed)
Pt was in office for PV, BI, while in room c/o dizziness, HA, left arm hurts when raised or positioned specifically. Most symptoms have been going on for 2 months. Bp left arm 90/42 right 98/54 these where laying down pressures, sitting up left arm 87/54, pulse 88-90. I reviewed med list with pt and he has taken all bp meds today. Spoke with dr Kirke Corin and amlodipine is to be stopped and he is to continue to monitor BP, Pt and family member informed and agree to plan. MAR updated.

## 2011-09-17 ENCOUNTER — Ambulatory Visit (HOSPITAL_COMMUNITY): Payer: Medicare Other | Attending: Cardiology

## 2011-09-17 DIAGNOSIS — R0600 Dyspnea, unspecified: Secondary | ICD-10-CM

## 2011-09-17 DIAGNOSIS — I10 Essential (primary) hypertension: Secondary | ICD-10-CM | POA: Insufficient documentation

## 2011-09-17 DIAGNOSIS — R079 Chest pain, unspecified: Secondary | ICD-10-CM | POA: Insufficient documentation

## 2011-09-17 DIAGNOSIS — E119 Type 2 diabetes mellitus without complications: Secondary | ICD-10-CM | POA: Insufficient documentation

## 2011-09-17 DIAGNOSIS — R0609 Other forms of dyspnea: Secondary | ICD-10-CM | POA: Insufficient documentation

## 2011-09-17 DIAGNOSIS — J449 Chronic obstructive pulmonary disease, unspecified: Secondary | ICD-10-CM | POA: Insufficient documentation

## 2011-09-17 DIAGNOSIS — R0602 Shortness of breath: Secondary | ICD-10-CM

## 2011-09-17 DIAGNOSIS — I2510 Atherosclerotic heart disease of native coronary artery without angina pectoris: Secondary | ICD-10-CM

## 2011-09-17 DIAGNOSIS — F17200 Nicotine dependence, unspecified, uncomplicated: Secondary | ICD-10-CM | POA: Insufficient documentation

## 2011-09-17 DIAGNOSIS — J4489 Other specified chronic obstructive pulmonary disease: Secondary | ICD-10-CM | POA: Insufficient documentation

## 2011-09-17 DIAGNOSIS — R0989 Other specified symptoms and signs involving the circulatory and respiratory systems: Secondary | ICD-10-CM | POA: Insufficient documentation

## 2011-09-17 DIAGNOSIS — E785 Hyperlipidemia, unspecified: Secondary | ICD-10-CM | POA: Insufficient documentation

## 2011-09-17 NOTE — Progress Notes (Signed)
Echocardiogram performed.  

## 2011-09-22 ENCOUNTER — Telehealth: Payer: Self-pay | Admitting: Pulmonary Disease

## 2011-09-22 ENCOUNTER — Ambulatory Visit (INDEPENDENT_AMBULATORY_CARE_PROVIDER_SITE_OTHER)
Admission: RE | Admit: 2011-09-22 | Discharge: 2011-09-22 | Disposition: A | Discharge status: Home / Self Care | Payer: Medicare Other | Source: Ambulatory Visit | Attending: Pulmonary Disease | Admitting: Pulmonary Disease

## 2011-09-22 ENCOUNTER — Ambulatory Visit (INDEPENDENT_AMBULATORY_CARE_PROVIDER_SITE_OTHER): Payer: Medicare Other | Admitting: Pulmonary Disease

## 2011-09-22 ENCOUNTER — Encounter: Payer: Self-pay | Admitting: Pulmonary Disease

## 2011-09-22 VITALS — BP 132/78 | HR 82 | Temp 97.7°F | Ht 66.0 in | Wt 211.6 lb

## 2011-09-22 DIAGNOSIS — J449 Chronic obstructive pulmonary disease, unspecified: Secondary | ICD-10-CM

## 2011-09-22 DIAGNOSIS — F17200 Nicotine dependence, unspecified, uncomplicated: Secondary | ICD-10-CM

## 2011-09-22 DIAGNOSIS — R0989 Other specified symptoms and signs involving the circulatory and respiratory systems: Secondary | ICD-10-CM

## 2011-09-22 DIAGNOSIS — J4489 Other specified chronic obstructive pulmonary disease: Secondary | ICD-10-CM

## 2011-09-22 DIAGNOSIS — R0683 Snoring: Secondary | ICD-10-CM

## 2011-09-22 DIAGNOSIS — R0600 Dyspnea, unspecified: Secondary | ICD-10-CM

## 2011-09-22 MED ORDER — MOMETASONE FURO-FORMOTEROL FUM 100-5 MCG/ACT IN AERO: 2.0000 | INHALATION_SPRAY | Freq: Two times a day (BID) | RESPIRATORY_TRACT | Status: DC

## 2011-09-22 MED ORDER — PREDNISONE 10 MG PO TABS: ORAL_TABLET | ORAL | Status: AC

## 2011-09-22 NOTE — Patient Instructions (Addendum)
Chest xray today Will arrange for sleep study Dulera two puffs twice per day, and rinse mouth after each use Continue spiriva one puff daily Prednisone 10 mg pill >> 3 pills for 2 days, 2 pills for 2 days, 1 pill for 2 days Follow up in 4 weeks

## 2011-09-22 NOTE — Telephone Encounter (Signed)
Dg Chest 2 View  09/22/2011  *RADIOLOGY REPORT*  Clinical Data: Dyspnea.  Cough and chest soreness.  CHEST - 2 VIEW  Comparison: Chest radiograph 12/27/2010  Findings: Heart size is normal.  Thoracic aorta and hilar contours are stable.  Slight prominence of the central pulmonary arteries noted.  Emphysematous changes are suspected, and there is hyperinflation.  The lungs are clear.  Negative for pleural effusion.  There is fixation hardware in the right clavicle.  The appearance of the hardware is stable.  IMPRESSION: Probable emphysema/COPD.  No acute findings.  Original Report Authenticated By: Britta Mccreedy, M.D.    Will have my nurse inform patient that chest xray shows expected changes from COPD.  No other findings.  No change to current treatment plan.

## 2011-09-22 NOTE — Progress Notes (Deleted)
  Subjective:    Patient ID: [name] [name], male    DOB: 20-Aug-1955, 56 y.o.   MRN: [id number]  HPI    Review of Systems  Constitutional: Positive for appetite change and unexpected weight change. Negative for fever.  HENT: Positive for congestion, sore throat, sneezing and trouble swallowing. Negative for ear pain and dental problem.   Respiratory: Positive for cough and shortness of breath.   Cardiovascular: Positive for chest pain, palpitations and leg swelling.  Gastrointestinal: Positive for abdominal pain.  Musculoskeletal: Positive for joint swelling.  Skin: Negative for rash.  Neurological: Positive for headaches.  Psychiatric/Behavioral: Positive for dysphoric mood. The patient is nervous/anxious.        Objective:   Physical Exam        Assessment & Plan:
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
## 2011-09-23 NOTE — Telephone Encounter (Signed)
I spoke with patient about results and he verbalized understanding and had no questions. Pt states he is not able to afford the dulera bc he is on disability. I have left 1 more sample upfront for pick up. Pt aware VS is on currently out of the office. He is requesting an alternative that is cheaper. Please advise VS thanks

## 2011-10-02 DIAGNOSIS — R0683 Snoring: Secondary | ICD-10-CM | POA: Insufficient documentation

## 2011-10-02 NOTE — Assessment & Plan Note (Signed)
Likely multifactorial.    He has severe obstruction on PFT from June 2012, and continued tobacco abuse.  He has hx of CAD, HTN, and grade 1 diastolic dysfunction.  He is obese, and likely has a component of deconditioning.

## 2011-10-02 NOTE — Assessment & Plan Note (Signed)
I have an extensive discussion about continued tobacco abuse is affecting his health.  He is reluctant to try anything for smoking cessation at this time due to concern about expense of therapy.  I explained to him that it will

## 2011-10-02 NOTE — Assessment & Plan Note (Signed)
He has snoring, and reports episodes of choking sensation while asleep.  He has sleep disruption and daytime sleepiness.  He has history of CAD, HTN and DM.  I am concerned he could have sleep apnea.  I have explained how sleep apnea can affect the patient's health.  Driving precautions and importance of weight loss were discussed.  Treatment options for sleep apnea were reviewed.  To further assess will arrange for in lab sleep study.

## 2011-10-02 NOTE — Assessment & Plan Note (Signed)
He has severe obstruction with diffusion defect on PFT from June 2012.  Will repeat chest xray, and then determine if he needs CT chest.  He has clinical evidence for bronchospasm on exam today.  I do not think he has infection at this time, and will defer antibiotic treatment.  Will give him taper of prednisone.  Will add dulera to his inhaler regimen (have given sample of this).  He is to continue spiriva, and as needed albuterol.  Will need to have repeat PFT and check alpha 1 antitrypsin level when he is stable.

## 2011-10-06 NOTE — Telephone Encounter (Signed)
Can we check with his insurance to determine if there is a less expensive alternative to dulera.  Options would be advair 250/50 or symbicort 160/4.5.  Please let me know.

## 2011-10-06 NOTE — Telephone Encounter (Signed)
I spoke with pt and he states his pcp Dr. Selena Batten gave him advair 500-50 1 puff BID daily--he states he was on this a while back but then was switched to spiriva. He states he has his book from his insurance and tells him what his insurance will cover. Pt is not at home and he states he will call back when he gets home. Will await pt call back

## 2011-10-13 NOTE — Telephone Encounter (Signed)
I spoke with pt and he states he still has to look in his book to see what is less expensive since pt did not know his # for his insurance for me to call. Pt states he is going to get back to me when he looks in his book

## 2011-10-14 ENCOUNTER — Ambulatory Visit (HOSPITAL_BASED_OUTPATIENT_CLINIC_OR_DEPARTMENT_OTHER): Payer: Medicare Other

## 2011-12-08 ENCOUNTER — Other Ambulatory Visit: Payer: Self-pay | Admitting: Physician Assistant

## 2012-01-07 ENCOUNTER — Other Ambulatory Visit: Payer: Self-pay | Admitting: Internal Medicine

## 2012-02-09 ENCOUNTER — Ambulatory Visit (HOSPITAL_COMMUNITY)
Admission: RE | Admit: 2012-02-09 | Discharge: 2012-02-09 | Disposition: A | Discharge status: Home / Self Care | Payer: Medicare Other | Source: Ambulatory Visit | Attending: Sports Medicine | Admitting: Sports Medicine

## 2012-02-09 ENCOUNTER — Other Ambulatory Visit (HOSPITAL_COMMUNITY): Payer: Self-pay | Admitting: Sports Medicine

## 2012-02-09 DIAGNOSIS — Z01818 Encounter for other preprocedural examination: Secondary | ICD-10-CM | POA: Insufficient documentation

## 2012-02-09 DIAGNOSIS — R52 Pain, unspecified: Secondary | ICD-10-CM

## 2012-02-17 ENCOUNTER — Ambulatory Visit: Payer: Medicare Other | Admitting: Cardiovascular Disease

## 2012-02-24 ENCOUNTER — Encounter: Payer: Self-pay | Admitting: Cardiovascular Disease

## 2012-02-24 ENCOUNTER — Ambulatory Visit (INDEPENDENT_AMBULATORY_CARE_PROVIDER_SITE_OTHER): Payer: Medicare Other | Admitting: Cardiovascular Disease

## 2012-02-24 VITALS — BP 120/80 | HR 89 | Ht 66.0 in | Wt 223.0 lb

## 2012-02-24 DIAGNOSIS — F17200 Nicotine dependence, unspecified, uncomplicated: Secondary | ICD-10-CM

## 2012-02-24 DIAGNOSIS — E785 Hyperlipidemia, unspecified: Secondary | ICD-10-CM

## 2012-02-24 DIAGNOSIS — I10 Essential (primary) hypertension: Secondary | ICD-10-CM

## 2012-02-24 DIAGNOSIS — I2510 Atherosclerotic heart disease of native coronary artery without angina pectoris: Secondary | ICD-10-CM

## 2012-02-24 NOTE — Patient Instructions (Addendum)
Your physician wants you to follow-up in: 6 months with a physician's assistant.   You will receive a reminder letter in the mail two months in advance. If you don't receive a letter, please call our office to schedule the follow-up appointment.

## 2012-02-26 ENCOUNTER — Encounter: Payer: Self-pay | Admitting: Cardiovascular Disease
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
## 2012-02-26 NOTE — Assessment & Plan Note (Signed)
Lab Results  Component Value Date   CHOL 115 07/06/2011   HDL 34.20* 07/06/2011   LDLCALC 59 07/06/2011   LDLDIRECT 73.4 04/03/2011   TRIG 108.0 07/06/2011   CHOLHDL 3 07/06/2011   continue treatment with pravastatin.

## 2012-02-26 NOTE — Assessment & Plan Note (Signed)
Blood pressure is well controlled on current medications. 

## 2012-02-26 NOTE — Assessment & Plan Note (Signed)
I discussed with him again the importance of smoking cessation. 

## 2012-02-26 NOTE — Assessment & Plan Note (Signed)
He seems to be overall stable with no change in his chest pain pattern. I recommend continuing medical therapy. His blood pressure is well controlled.  He might need to have shoulder surgery in the near future. No further ischemic workup is needed before the surgery. He can proceed if needed with an overall low to moderate risk from a cardiac standpoint. Plavix can be held 7 days before the surgery and should be resumed after.

## 2012-03-07 ENCOUNTER — Encounter (HOSPITAL_BASED_OUTPATIENT_CLINIC_OR_DEPARTMENT_OTHER): Payer: Self-pay | Admitting: *Deleted

## 2012-03-07 ENCOUNTER — Emergency Department (HOSPITAL_BASED_OUTPATIENT_CLINIC_OR_DEPARTMENT_OTHER)
Admission: EM | Admit: 2012-03-07 | Discharge: 2012-03-08 | Disposition: A | Discharge status: Home / Self Care | Payer: Medicare Other | Attending: Emergency Medicine | Admitting: Emergency Medicine

## 2012-03-07 DIAGNOSIS — I2510 Atherosclerotic heart disease of native coronary artery without angina pectoris: Secondary | ICD-10-CM | POA: Insufficient documentation

## 2012-03-07 DIAGNOSIS — F329 Major depressive disorder, single episode, unspecified: Secondary | ICD-10-CM | POA: Insufficient documentation

## 2012-03-07 DIAGNOSIS — Z9889 Other specified postprocedural states: Secondary | ICD-10-CM | POA: Insufficient documentation

## 2012-03-07 DIAGNOSIS — E119 Type 2 diabetes mellitus without complications: Secondary | ICD-10-CM | POA: Insufficient documentation

## 2012-03-07 DIAGNOSIS — J4489 Other specified chronic obstructive pulmonary disease: Secondary | ICD-10-CM | POA: Insufficient documentation

## 2012-03-07 DIAGNOSIS — E785 Hyperlipidemia, unspecified: Secondary | ICD-10-CM | POA: Insufficient documentation

## 2012-03-07 DIAGNOSIS — Z79899 Other long term (current) drug therapy: Secondary | ICD-10-CM | POA: Insufficient documentation

## 2012-03-07 DIAGNOSIS — R61 Generalized hyperhidrosis: Secondary | ICD-10-CM | POA: Insufficient documentation

## 2012-03-07 DIAGNOSIS — J449 Chronic obstructive pulmonary disease, unspecified: Secondary | ICD-10-CM | POA: Insufficient documentation

## 2012-03-07 DIAGNOSIS — R109 Unspecified abdominal pain: Secondary | ICD-10-CM | POA: Insufficient documentation

## 2012-03-07 DIAGNOSIS — F17200 Nicotine dependence, unspecified, uncomplicated: Secondary | ICD-10-CM | POA: Insufficient documentation

## 2012-03-07 DIAGNOSIS — Z7982 Long term (current) use of aspirin: Secondary | ICD-10-CM | POA: Insufficient documentation

## 2012-03-07 DIAGNOSIS — Z794 Long term (current) use of insulin: Secondary | ICD-10-CM | POA: Insufficient documentation

## 2012-03-07 DIAGNOSIS — I10 Essential (primary) hypertension: Secondary | ICD-10-CM | POA: Insufficient documentation

## 2012-03-07 DIAGNOSIS — F3289 Other specified depressive episodes: Secondary | ICD-10-CM | POA: Insufficient documentation

## 2012-03-07 LAB — URINE MICROSCOPIC-ADD ON

## 2012-03-07 LAB — URINALYSIS, ROUTINE W REFLEX MICROSCOPIC
Bilirubin Urine: NEGATIVE
Glucose, UA: >1000 mg/dL — AB
Ketones, ur: NEGATIVE mg/dL
Protein, ur: NEGATIVE mg/dL
Urobilinogen, UA: 1 mg/dL (ref 0.0–1.0)

## 2012-03-07 NOTE — ED Notes (Signed)
Pt c/o lower left abd pain , denies n/v/d
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
## 2012-03-08 ENCOUNTER — Emergency Department (HOSPITAL_BASED_OUTPATIENT_CLINIC_OR_DEPARTMENT_OTHER): Payer: Medicare Other

## 2012-03-08 LAB — COMPREHENSIVE METABOLIC PANEL
ALT: 33 U/L (ref 0–53)
Alkaline Phosphatase: 140 U/L — ABNORMAL HIGH (ref 39–117)
BUN: 9 mg/dL (ref 6–23)
CO2: 23 mEq/L (ref 19–32)
Calcium: 9.4 mg/dL (ref 8.4–10.5)
GFR calc Af Amer: >90 mL/min (ref >90)
GFR calc non Af Amer: >90 mL/min (ref >90)
Glucose, Bld: 238 mg/dL — ABNORMAL HIGH (ref 70–99)
Potassium: 4.6 mEq/L (ref 3.5–5.1)
Sodium: 134 mEq/L — ABNORMAL LOW (ref 135–145)

## 2012-03-08 LAB — CBC WITH DIFFERENTIAL/PLATELET
Eosinophils Relative: 3 % (ref 0–5)
HCT: 47 % (ref 39.0–52.0)
Hemoglobin: 16.5 g/dL (ref 13.0–17.0)
Lymphocytes Relative: 32 % (ref 12–46)
Lymphs Abs: 4.2 10*3/uL — ABNORMAL HIGH (ref 0.7–4.0)
MCV: 85.1 fL (ref 78.0–100.0)
Monocytes Relative: 6 % (ref 3–12)
Platelets: 258 10*3/uL (ref 150–400)
RBC: 5.52 MIL/uL (ref 4.22–5.81)
WBC: 13.1 10*3/uL — ABNORMAL HIGH (ref 4.0–10.5)

## 2012-03-08 MED ORDER — SODIUM CHLORIDE 0.9 % IV BOLUS (SEPSIS)
1000.0000 mL | Freq: Once | INTRAVENOUS | Status: AC
  Administered 2012-03-08: 1000 mL via INTRAVENOUS

## 2012-03-08 MED ORDER — OXYCODONE-ACETAMINOPHEN 5-325 MG PO TABS: 2.0000 | ORAL_TABLET | ORAL | Status: DC | PRN

## 2012-03-08 MED ORDER — ONDANSETRON HCL 4 MG/2ML IJ SOLN
4.0000 mg | Freq: Once | INTRAMUSCULAR | Status: AC
Start: 2012-03-08 — End: 2012-03-08
  Administered 2012-03-08: 4 mg via INTRAVENOUS
  Filled 2012-03-08: qty 2

## 2012-03-08 MED ORDER — OXYCODONE-ACETAMINOPHEN 5-325 MG PO TABS
ORAL_TABLET | ORAL | Status: AC
  Filled 2012-03-08: qty 1

## 2012-03-08 MED ORDER — IOHEXOL 300 MG/ML  SOLN
100.0000 mL | Freq: Once | INTRAMUSCULAR | Status: AC | PRN
  Administered 2012-03-08: 100 mL via INTRAVENOUS

## 2012-03-08 MED ORDER — IOHEXOL 300 MG/ML  SOLN
50.0000 mL | Freq: Once | INTRAMUSCULAR | Status: AC | PRN
  Administered 2012-03-08: 50 mL via ORAL

## 2012-03-08 MED ORDER — ONDANSETRON HCL 4 MG PO TABS: 4.0000 mg | ORAL_TABLET | Freq: Four times a day (QID) | ORAL | Status: DC

## 2012-03-08 MED ORDER — HYDROMORPHONE HCL PF 1 MG/ML IJ SOLN
1.0000 mg | Freq: Once | INTRAMUSCULAR | Status: AC
  Administered 2012-03-08: 1 mg via INTRAVENOUS
  Filled 2012-03-08: qty 1

## 2012-03-08 MED ORDER — OXYCODONE-ACETAMINOPHEN 5-325 MG PO TABS
1.0000 | ORAL_TABLET | Freq: Once | ORAL | Status: AC
  Administered 2012-03-08: 1 via ORAL
  Filled 2012-03-08: qty 1

## 2012-04-15 ENCOUNTER — Emergency Department (HOSPITAL_BASED_OUTPATIENT_CLINIC_OR_DEPARTMENT_OTHER)
Admission: EM | Admit: 2012-04-15 | Discharge: 2012-04-15 | Disposition: A | Discharge status: Home / Self Care | Payer: Medicare Other | Attending: Emergency Medicine | Admitting: Emergency Medicine

## 2012-04-15 ENCOUNTER — Encounter (HOSPITAL_BASED_OUTPATIENT_CLINIC_OR_DEPARTMENT_OTHER): Payer: Self-pay | Admitting: *Deleted

## 2012-04-15 DIAGNOSIS — I2510 Atherosclerotic heart disease of native coronary artery without angina pectoris: Secondary | ICD-10-CM | POA: Insufficient documentation

## 2012-04-15 DIAGNOSIS — E119 Type 2 diabetes mellitus without complications: Secondary | ICD-10-CM | POA: Insufficient documentation

## 2012-04-15 DIAGNOSIS — Y939 Activity, unspecified: Secondary | ICD-10-CM | POA: Insufficient documentation

## 2012-04-15 DIAGNOSIS — E785 Hyperlipidemia, unspecified: Secondary | ICD-10-CM | POA: Insufficient documentation

## 2012-04-15 DIAGNOSIS — J4489 Other specified chronic obstructive pulmonary disease: Secondary | ICD-10-CM | POA: Insufficient documentation

## 2012-04-15 DIAGNOSIS — S0993XA Unspecified injury of face, initial encounter: Secondary | ICD-10-CM | POA: Insufficient documentation

## 2012-04-15 DIAGNOSIS — F17200 Nicotine dependence, unspecified, uncomplicated: Secondary | ICD-10-CM | POA: Insufficient documentation

## 2012-04-15 DIAGNOSIS — Z7982 Long term (current) use of aspirin: Secondary | ICD-10-CM | POA: Insufficient documentation

## 2012-04-15 DIAGNOSIS — G8929 Other chronic pain: Secondary | ICD-10-CM | POA: Insufficient documentation

## 2012-04-15 DIAGNOSIS — Y929 Unspecified place or not applicable: Secondary | ICD-10-CM | POA: Insufficient documentation

## 2012-04-15 DIAGNOSIS — Z9861 Coronary angioplasty status: Secondary | ICD-10-CM | POA: Insufficient documentation

## 2012-04-15 DIAGNOSIS — Z7902 Long term (current) use of antithrombotics/antiplatelets: Secondary | ICD-10-CM | POA: Insufficient documentation

## 2012-04-15 DIAGNOSIS — Z8659 Personal history of other mental and behavioral disorders: Secondary | ICD-10-CM | POA: Insufficient documentation

## 2012-04-15 DIAGNOSIS — J449 Chronic obstructive pulmonary disease, unspecified: Secondary | ICD-10-CM | POA: Insufficient documentation

## 2012-04-15 DIAGNOSIS — S199XXA Unspecified injury of neck, initial encounter: Secondary | ICD-10-CM | POA: Insufficient documentation

## 2012-04-15 DIAGNOSIS — Z79899 Other long term (current) drug therapy: Secondary | ICD-10-CM | POA: Insufficient documentation

## 2012-04-15 DIAGNOSIS — Z8709 Personal history of other diseases of the respiratory system: Secondary | ICD-10-CM | POA: Insufficient documentation

## 2012-04-15 DIAGNOSIS — I10 Essential (primary) hypertension: Secondary | ICD-10-CM | POA: Insufficient documentation

## 2012-04-15 DIAGNOSIS — M549 Dorsalgia, unspecified: Secondary | ICD-10-CM | POA: Insufficient documentation

## 2012-04-15 DIAGNOSIS — X58XXXA Exposure to other specified factors, initial encounter: Secondary | ICD-10-CM | POA: Insufficient documentation

## 2012-04-15 MED ORDER — ALBUTEROL SULFATE (5 MG/ML) 0.5% IN NEBU
INHALATION_SOLUTION | RESPIRATORY_TRACT | Status: AC
  Administered 2012-04-15: 5 mg via RESPIRATORY_TRACT
  Filled 2012-04-15: qty 1

## 2012-04-15 MED ORDER — ALBUTEROL SULFATE (5 MG/ML) 0.5% IN NEBU
5.0000 mg | INHALATION_SOLUTION | RESPIRATORY_TRACT | Status: DC
  Administered 2012-04-15: 5 mg via RESPIRATORY_TRACT

## 2012-04-15 MED ORDER — BENZOCAINE 20 % MT SOLN
Freq: Once | OROMUCOSAL | Status: AC
  Administered 2012-04-15: 02:00:00 via OROMUCOSAL

## 2012-04-15 MED ORDER — CLINDAMYCIN HCL 300 MG PO CAPS: 300.0000 mg | ORAL_CAPSULE | Freq: Three times a day (TID) | ORAL | Status: DC

## 2012-04-15 MED ORDER — IPRATROPIUM BROMIDE 0.02 % IN SOLN
0.5000 mg | RESPIRATORY_TRACT | Status: DC
  Administered 2012-04-15: 0.5 mg via RESPIRATORY_TRACT

## 2012-04-15 MED ORDER — OXYCODONE-ACETAMINOPHEN 5-325 MG PO TABS
2.0000 | ORAL_TABLET | Freq: Once | ORAL | Status: DC
  Filled 2012-04-15 (×2): qty 2

## 2012-04-15 MED ORDER — IPRATROPIUM BROMIDE 0.02 % IN SOLN
RESPIRATORY_TRACT | Status: AC
  Administered 2012-04-15: 0.5 mg via RESPIRATORY_TRACT
  Filled 2012-04-15: qty 2.5

## 2012-04-15 MED ORDER — BENZOCAINE 20 % MT SOLN
OROMUCOSAL | Status: AC
  Filled 2012-04-15: qty 57

## 2012-04-15 NOTE — ED Notes (Signed)
Pt c/o laceration? To back of throat x several hrs

## 2012-04-15 NOTE — ED Notes (Signed)
Pt has percocet ordered but drove self to ED. Pt states he can have daughter pick him up if necessary. Pt made aware percocet would not be given until ride is here in the department.
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
## 2012-06-01 ENCOUNTER — Ambulatory Visit: Payer: Medicare Other | Admitting: Cardiovascular Disease

## 2012-06-15 ENCOUNTER — Ambulatory Visit: Payer: Medicare Other | Admitting: Cardiovascular Disease

## 2012-06-23 ENCOUNTER — Encounter: Payer: Self-pay | Admitting: Cardiovascular Disease

## 2012-09-27 ENCOUNTER — Ambulatory Visit: Payer: Medicare Other | Admitting: Cardiovascular Disease

## 2012-10-06 ENCOUNTER — Other Ambulatory Visit: Payer: Self-pay | Admitting: Physician Assistant

## 2012-10-07 ENCOUNTER — Telehealth: Payer: Self-pay | Admitting: Physician Assistant

## 2012-10-07 MED ORDER — CLOPIDOGREL BISULFATE 75 MG PO TABS: 75.0000 mg | ORAL_TABLET | Freq: Every day | ORAL | Status: DC

## 2012-10-07 NOTE — Telephone Encounter (Signed)
Pt called on-call answering service on July 4 with 2 office questions. 1) He thinks he missed a f/u appt but has not received a call from our office. Is wondering when next f/u appt is. 2) Needs refill on Plavix 75mg  daily - pharmacy had sent in refill request to our office but he did not hear back on this. Last office note reviewed, confirmed Plavix 75mg  daily, per Dr. Kirke Corin "Plavix can be held 7 days before the surgery and should be resumed after." There is no mention of stop date. Thus I sent in e-prescription to pt's pharmacy for 30 tablets, 0 refills, and asked pt to call office on Monday to determine status of f/u appointment as well as whether he should continue Plavix (I am unable to access office pharmacy refill requests so unsure if there is a reason why it has not yet been filled). Pt verbalized understanding and gratitude and agreement with plan. Dayna Dunn PA-C

## 2012-10-07 NOTE — Telephone Encounter (Signed)
Schedule him for a routine follow up visit.

## 2012-10-10 NOTE — Telephone Encounter (Signed)
Left message on voicemail for pt to contact the office for appointment.

## 2012-10-13 NOTE — Telephone Encounter (Signed)
Pt scheduled to see Dr Kirke Corin 7/22.

## 2012-10-25 ENCOUNTER — Encounter: Payer: Self-pay | Admitting: Cardiovascular Disease

## 2012-10-25 ENCOUNTER — Ambulatory Visit (INDEPENDENT_AMBULATORY_CARE_PROVIDER_SITE_OTHER): Payer: Medicare Other | Admitting: Cardiovascular Disease

## 2012-10-25 VITALS — BP 118/86 | HR 81 | Ht 66.0 in | Wt 214.0 lb

## 2012-10-25 DIAGNOSIS — E785 Hyperlipidemia, unspecified: Secondary | ICD-10-CM

## 2012-10-25 DIAGNOSIS — I2510 Atherosclerotic heart disease of native coronary artery without angina pectoris: Secondary | ICD-10-CM

## 2012-10-25 DIAGNOSIS — I10 Essential (primary) hypertension: Secondary | ICD-10-CM

## 2012-10-25 MED ORDER — ASPIRIN 81 MG PO TABS: 162.0000 mg | ORAL_TABLET | Freq: Every day | ORAL | Status: DC

## 2012-10-25 NOTE — Assessment & Plan Note (Signed)
Blood pressure is well controlled on current medications. 
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
## 2012-10-25 NOTE — Assessment & Plan Note (Addendum)
He seems to be stable from a cardiac standpoint with no convincing symptoms of angina. I recommend continuing medical therapy. I congratulated him on smoking cessation. His drug-eluting stent was placed in 2011 and thus I think it's safe to discontinue Plavix at this time and continue treatment with aspirin.

## 2012-10-25 NOTE — Patient Instructions (Addendum)
Your physician has recommended you make the following change in your medication: STOP Plavix, INCREASE Aspirin to 81mg  take two tablets by mouth daily, Please make sure you are not taking Pravastatin  Your physician wants you to follow-up in: 6 MONTHS with Dr Kirke Corin.  You will receive a reminder letter in the mail two months in advance. If you don't receive a letter, please call our office to schedule the follow-up appointment.

## 2012-10-25 NOTE — Assessment & Plan Note (Signed)
Lab Results  Component Value Date   CHOL 115 07/06/2011   HDL 34.20* 07/06/2011   LDLCALC 59 07/06/2011   LDLDIRECT 73.4 04/03/2011   TRIG 108.0 07/06/2011   CHOLHDL 3 07/06/2011   Continue treatment with atorvastatin.

## 2012-11-04 ENCOUNTER — Other Ambulatory Visit: Payer: Self-pay | Admitting: *Deleted

## 2012-11-04 MED ORDER — ISOSORBIDE MONONITRATE ER 30 MG PO TB24: ORAL_TABLET | ORAL | Status: DC

## 2012-12-12 ENCOUNTER — Emergency Department (HOSPITAL_BASED_OUTPATIENT_CLINIC_OR_DEPARTMENT_OTHER): Payer: Medicare Other

## 2012-12-12 ENCOUNTER — Encounter (HOSPITAL_BASED_OUTPATIENT_CLINIC_OR_DEPARTMENT_OTHER): Payer: Self-pay

## 2012-12-12 ENCOUNTER — Emergency Department (HOSPITAL_BASED_OUTPATIENT_CLINIC_OR_DEPARTMENT_OTHER)
Admission: EM | Admit: 2012-12-12 | Discharge: 2012-12-12 | Disposition: A | Discharge status: Home / Self Care | Payer: Medicare Other | Attending: Emergency Medicine | Admitting: Emergency Medicine

## 2012-12-12 DIAGNOSIS — F17200 Nicotine dependence, unspecified, uncomplicated: Secondary | ICD-10-CM | POA: Insufficient documentation

## 2012-12-12 DIAGNOSIS — Z7902 Long term (current) use of antithrombotics/antiplatelets: Secondary | ICD-10-CM | POA: Insufficient documentation

## 2012-12-12 DIAGNOSIS — R112 Nausea with vomiting, unspecified: Secondary | ICD-10-CM | POA: Insufficient documentation

## 2012-12-12 DIAGNOSIS — G8929 Other chronic pain: Secondary | ICD-10-CM | POA: Insufficient documentation

## 2012-12-12 DIAGNOSIS — K921 Melena: Secondary | ICD-10-CM | POA: Insufficient documentation

## 2012-12-12 DIAGNOSIS — E119 Type 2 diabetes mellitus without complications: Secondary | ICD-10-CM | POA: Insufficient documentation

## 2012-12-12 DIAGNOSIS — N200 Calculus of kidney: Secondary | ICD-10-CM

## 2012-12-12 DIAGNOSIS — Z9861 Coronary angioplasty status: Secondary | ICD-10-CM | POA: Insufficient documentation

## 2012-12-12 DIAGNOSIS — F3289 Other specified depressive episodes: Secondary | ICD-10-CM | POA: Insufficient documentation

## 2012-12-12 DIAGNOSIS — Z8709 Personal history of other diseases of the respiratory system: Secondary | ICD-10-CM | POA: Insufficient documentation

## 2012-12-12 DIAGNOSIS — J449 Chronic obstructive pulmonary disease, unspecified: Secondary | ICD-10-CM | POA: Insufficient documentation

## 2012-12-12 DIAGNOSIS — R509 Fever, unspecified: Secondary | ICD-10-CM | POA: Insufficient documentation

## 2012-12-12 DIAGNOSIS — R319 Hematuria, unspecified: Secondary | ICD-10-CM | POA: Insufficient documentation

## 2012-12-12 DIAGNOSIS — R61 Generalized hyperhidrosis: Secondary | ICD-10-CM | POA: Insufficient documentation

## 2012-12-12 DIAGNOSIS — I2510 Atherosclerotic heart disease of native coronary artery without angina pectoris: Secondary | ICD-10-CM | POA: Insufficient documentation

## 2012-12-12 DIAGNOSIS — E785 Hyperlipidemia, unspecified: Secondary | ICD-10-CM | POA: Insufficient documentation

## 2012-12-12 DIAGNOSIS — Z79899 Other long term (current) drug therapy: Secondary | ICD-10-CM | POA: Insufficient documentation

## 2012-12-12 DIAGNOSIS — Z794 Long term (current) use of insulin: Secondary | ICD-10-CM | POA: Insufficient documentation

## 2012-12-12 DIAGNOSIS — M549 Dorsalgia, unspecified: Secondary | ICD-10-CM | POA: Insufficient documentation

## 2012-12-12 DIAGNOSIS — F329 Major depressive disorder, single episode, unspecified: Secondary | ICD-10-CM | POA: Insufficient documentation

## 2012-12-12 DIAGNOSIS — M545 Low back pain, unspecified: Secondary | ICD-10-CM | POA: Insufficient documentation

## 2012-12-12 DIAGNOSIS — I10 Essential (primary) hypertension: Secondary | ICD-10-CM | POA: Insufficient documentation

## 2012-12-12 DIAGNOSIS — J4489 Other specified chronic obstructive pulmonary disease: Secondary | ICD-10-CM | POA: Insufficient documentation

## 2012-12-12 LAB — URINALYSIS, ROUTINE W REFLEX MICROSCOPIC
Glucose, UA: 250 mg/dL — AB
Ketones, ur: 15 mg/dL — AB
Protein, ur: 30 mg/dL — AB
pH: 5.5 (ref 5.0–8.0)

## 2012-12-12 LAB — URINE MICROSCOPIC-ADD ON

## 2012-12-12 MED ORDER — OXYCODONE-ACETAMINOPHEN 10-325 MG PO TABS: 1.0000 | ORAL_TABLET | Freq: Four times a day (QID) | ORAL | Status: AC | PRN

## 2012-12-12 MED ORDER — HYDROMORPHONE HCL PF 1 MG/ML IJ SOLN
1.0000 mg | Freq: Once | INTRAMUSCULAR | Status: AC
  Administered 2012-12-12: 1 mg via INTRAVENOUS
  Filled 2012-12-12: qty 1

## 2012-12-12 MED ORDER — ONDANSETRON HCL 4 MG/2ML IJ SOLN
4.0000 mg | Freq: Once | INTRAMUSCULAR | Status: AC
  Administered 2012-12-12: 4 mg via INTRAVENOUS
  Filled 2012-12-12: qty 2

## 2012-12-12 MED ORDER — METOCLOPRAMIDE HCL 10 MG PO TABS: 10.0000 mg | ORAL_TABLET | Freq: Four times a day (QID) | ORAL | Status: DC | PRN

## 2012-12-12 MED ORDER — SODIUM CHLORIDE 0.9 % IV BOLUS (SEPSIS)
1000.0000 mL | Freq: Once | INTRAVENOUS | Status: AC
  Administered 2012-12-12: 1000 mL via INTRAVENOUS

## 2012-12-12 MED ORDER — KETOROLAC TROMETHAMINE 30 MG/ML IJ SOLN
30.0000 mg | Freq: Once | INTRAMUSCULAR | Status: AC
  Administered 2012-12-12: 30 mg via INTRAVENOUS
  Filled 2012-12-12: qty 1

## 2012-12-12 MED ORDER — TAMSULOSIN HCL 0.4 MG PO CAPS: 0.4000 mg | ORAL_CAPSULE | Freq: Every day | ORAL | Status: DC
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
## 2012-12-12 NOTE — ED Notes (Signed)
Right flank pain x 5 hours-bloody urine within last 30-40 min

## 2012-12-12 NOTE — ED Notes (Signed)
Diaphoretic, in more pain.

## 2012-12-12 NOTE — ED Notes (Signed)
Urinal emptied

## 2012-12-12 NOTE — ED Notes (Signed)
Patient transported to CT via stretcher per tech. 

## 2012-12-12 NOTE — ED Notes (Signed)
Patient transported to CT 

## 2012-12-12 NOTE — ED Notes (Signed)
MD at bedside to discuss results of testing. 

## 2013-01-30 ENCOUNTER — Emergency Department (HOSPITAL_BASED_OUTPATIENT_CLINIC_OR_DEPARTMENT_OTHER): Payer: Medicare Other

## 2013-01-30 ENCOUNTER — Emergency Department (HOSPITAL_BASED_OUTPATIENT_CLINIC_OR_DEPARTMENT_OTHER)
Admission: EM | Admit: 2013-01-30 | Discharge: 2013-01-30 | Disposition: A | Discharge status: Other Acute Inpatient Hospital | Payer: Medicare Other | Attending: Emergency Medicine | Admitting: Emergency Medicine

## 2013-01-30 ENCOUNTER — Encounter (HOSPITAL_BASED_OUTPATIENT_CLINIC_OR_DEPARTMENT_OTHER): Payer: Self-pay | Admitting: Emergency Medicine

## 2013-01-30 DIAGNOSIS — Z794 Long term (current) use of insulin: Secondary | ICD-10-CM | POA: Insufficient documentation

## 2013-01-30 DIAGNOSIS — Z7982 Long term (current) use of aspirin: Secondary | ICD-10-CM | POA: Insufficient documentation

## 2013-01-30 DIAGNOSIS — E785 Hyperlipidemia, unspecified: Secondary | ICD-10-CM | POA: Insufficient documentation

## 2013-01-30 DIAGNOSIS — I2510 Atherosclerotic heart disease of native coronary artery without angina pectoris: Secondary | ICD-10-CM | POA: Insufficient documentation

## 2013-01-30 DIAGNOSIS — K5732 Diverticulitis of large intestine without perforation or abscess without bleeding: Secondary | ICD-10-CM | POA: Insufficient documentation

## 2013-01-30 DIAGNOSIS — Z9861 Coronary angioplasty status: Secondary | ICD-10-CM | POA: Insufficient documentation

## 2013-01-30 DIAGNOSIS — K5792 Diverticulitis of intestine, part unspecified, without perforation or abscess without bleeding: Secondary | ICD-10-CM

## 2013-01-30 DIAGNOSIS — Z79899 Other long term (current) drug therapy: Secondary | ICD-10-CM | POA: Insufficient documentation

## 2013-01-30 DIAGNOSIS — Z792 Long term (current) use of antibiotics: Secondary | ICD-10-CM | POA: Insufficient documentation

## 2013-01-30 DIAGNOSIS — F17200 Nicotine dependence, unspecified, uncomplicated: Secondary | ICD-10-CM | POA: Insufficient documentation

## 2013-01-30 DIAGNOSIS — J449 Chronic obstructive pulmonary disease, unspecified: Secondary | ICD-10-CM | POA: Insufficient documentation

## 2013-01-30 DIAGNOSIS — J4489 Other specified chronic obstructive pulmonary disease: Secondary | ICD-10-CM | POA: Insufficient documentation

## 2013-01-30 DIAGNOSIS — Z8659 Personal history of other mental and behavioral disorders: Secondary | ICD-10-CM | POA: Insufficient documentation

## 2013-01-30 DIAGNOSIS — G8929 Other chronic pain: Secondary | ICD-10-CM | POA: Insufficient documentation

## 2013-01-30 DIAGNOSIS — I10 Essential (primary) hypertension: Secondary | ICD-10-CM | POA: Insufficient documentation

## 2013-01-30 DIAGNOSIS — E119 Type 2 diabetes mellitus without complications: Secondary | ICD-10-CM | POA: Insufficient documentation

## 2013-01-30 HISTORY — DX: Diverticulosis of intestine, part unspecified, without perforation or abscess without bleeding: K57.90

## 2013-01-30 LAB — COMPREHENSIVE METABOLIC PANEL
CO2: 27 mEq/L (ref 19–32)
Calcium: 9.1 mg/dL (ref 8.4–10.5)
Creatinine, Ser: 0.7 mg/dL (ref 0.50–1.35)
GFR calc Af Amer: >90 mL/min (ref >90)
GFR calc non Af Amer: >90 mL/min (ref >90)
Glucose, Bld: 140 mg/dL — ABNORMAL HIGH (ref 70–99)

## 2013-01-30 LAB — CBC WITH DIFFERENTIAL/PLATELET
Basophils Absolute: 0 10*3/uL (ref 0.0–0.1)
Eosinophils Relative: 3 % (ref 0–5)
HCT: 45 % (ref 39.0–52.0)
Lymphocytes Relative: 18 % (ref 12–46)
MCH: 31.1 pg (ref 26.0–34.0)
MCV: 90.9 fL (ref 78.0–100.0)
Monocytes Absolute: 1.2 10*3/uL — ABNORMAL HIGH (ref 0.1–1.0)
RDW: 13.8 % (ref 11.5–15.5)
WBC: 13.8 10*3/uL — ABNORMAL HIGH (ref 4.0–10.5)

## 2013-01-30 LAB — URINE MICROSCOPIC-ADD ON

## 2013-01-30 LAB — URINALYSIS, ROUTINE W REFLEX MICROSCOPIC
Glucose, UA: NEGATIVE mg/dL
Hgb urine dipstick: NEGATIVE
Ketones, ur: 15 mg/dL — AB
Protein, ur: NEGATIVE mg/dL

## 2013-01-30 LAB — OCCULT BLOOD X 1 CARD TO LAB, STOOL: Fecal Occult Bld: NEGATIVE

## 2013-01-30 MED ORDER — DEXTROSE 5 % IV SOLN
1.0000 g | Freq: Once | INTRAVENOUS | Status: AC
  Administered 2013-01-30: 1 g via INTRAVENOUS

## 2013-01-30 MED ORDER — CIPROFLOXACIN IN D5W 400 MG/200ML IV SOLN
400.0000 mg | Freq: Once | INTRAVENOUS | Status: DC
  Filled 2013-01-30: qty 200

## 2013-01-30 MED ORDER — CEFTRIAXONE SODIUM 1 G IJ SOLR
INTRAMUSCULAR | Status: AC
  Administered 2013-01-30: 1000 mg
  Filled 2013-01-30: qty 10

## 2013-01-30 MED ORDER — NICOTINE 21 MG/24HR TD PT24
21.0000 mg | MEDICATED_PATCH | Freq: Once | TRANSDERMAL | Status: DC
  Administered 2013-01-30: 21 mg via TRANSDERMAL
  Filled 2013-01-30: qty 1

## 2013-01-30 MED ORDER — ONDANSETRON HCL 4 MG/2ML IJ SOLN
4.0000 mg | Freq: Once | INTRAMUSCULAR | Status: AC
  Administered 2013-01-30: 4 mg via INTRAVENOUS
  Filled 2013-01-30: qty 2

## 2013-01-30 MED ORDER — SODIUM CHLORIDE 0.9 % IV SOLN
Freq: Once | INTRAVENOUS | Status: AC
  Administered 2013-01-30: 20 mL/h via INTRAVENOUS

## 2013-01-30 MED ORDER — IOHEXOL 300 MG/ML  SOLN
100.0000 mL | Freq: Once | INTRAMUSCULAR | Status: AC | PRN
  Administered 2013-01-30: 100 mL via INTRAVENOUS

## 2013-01-30 MED ORDER — METRONIDAZOLE IN NACL 5-0.79 MG/ML-% IV SOLN
500.0000 mg | Freq: Once | INTRAVENOUS | Status: AC
  Administered 2013-01-30: 500 mg via INTRAVENOUS
  Filled 2013-01-30: qty 100

## 2013-01-30 MED ORDER — SODIUM CHLORIDE 0.9 % IV BOLUS (SEPSIS)
1000.0000 mL | Freq: Once | INTRAVENOUS | Status: AC
  Administered 2013-01-30: 1000 mL via INTRAVENOUS

## 2013-01-30 MED ORDER — HYDROMORPHONE HCL PF 1 MG/ML IJ SOLN
1.0000 mg | Freq: Once | INTRAMUSCULAR | Status: AC
  Administered 2013-01-30: 1 mg via INTRAVENOUS
  Filled 2013-01-30: qty 1

## 2013-01-30 MED ORDER — IOHEXOL 300 MG/ML  SOLN
50.0000 mL | Freq: Once | INTRAMUSCULAR | Status: AC | PRN
  Administered 2013-01-30: 50 mL via ORAL

## 2013-01-30 NOTE — ED Notes (Signed)
Foley dc intact    650 cc yellow urine
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
## 2013-01-30 NOTE — ED Notes (Signed)
Pt w c/o unable to urinate x 2 days,  Last bm on Saturday,  Pt sweaty,  16 f foley inserted without diff,  50 cc amber urine returned

## 2013-04-18 ENCOUNTER — Ambulatory Visit (INDEPENDENT_AMBULATORY_CARE_PROVIDER_SITE_OTHER): Payer: Medicare Other | Admitting: Cardiovascular Disease

## 2013-04-18 ENCOUNTER — Encounter: Payer: Self-pay | Admitting: Cardiovascular Disease

## 2013-04-18 VITALS — BP 123/74 | HR 91 | Ht 66.0 in | Wt 224.0 lb

## 2013-04-18 DIAGNOSIS — I10 Essential (primary) hypertension: Secondary | ICD-10-CM

## 2013-04-18 DIAGNOSIS — I2510 Atherosclerotic heart disease of native coronary artery without angina pectoris: Secondary | ICD-10-CM

## 2013-04-18 DIAGNOSIS — E785 Hyperlipidemia, unspecified: Secondary | ICD-10-CM

## 2013-04-18 DIAGNOSIS — F17200 Nicotine dependence, unspecified, uncomplicated: Secondary | ICD-10-CM

## 2013-04-18 MED ORDER — ISOSORBIDE MONONITRATE ER 60 MG PO TB24: 60.0000 mg | ORAL_TABLET | Freq: Every day | ORAL | Status: AC

## 2013-04-18 NOTE — Patient Instructions (Signed)
Your physician wants you to follow-up in 6 months with Dr. Kirke CorinArida. You will receive a reminder letter in the mail two months in advance. If you don't receive a letter, please call our office to schedule the follow-up appointment.  Increase Imdur to 60mg  every day.  You have been referred to the Smoking Cessation Program at the [facility]. [phone].  Please call when you get home for an appointment.

## 2013-04-18 NOTE — Assessment & Plan Note (Signed)
Continue medical therapy. Increase due to 60 mg once daily to try to minimize the use of sublingual nitroglycerin. I again explained to him that Plavix is optional at this point given that the drug-eluting stent was placed more than a year ago.

## 2013-04-18 NOTE — Assessment & Plan Note (Signed)
Blood pressure is well controlled on current medications. 

## 2013-04-18 NOTE — Assessment & Plan Note (Signed)
He seems to be having significant worsening and COPD likely due to continued tobacco use. He has not been able to quit smoking on his own. I am referring him to the smoking cessation class.
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
## 2013-04-18 NOTE — Assessment & Plan Note (Signed)
Continue treatment with atorvastatin. 

## 2013-07-14 ENCOUNTER — Emergency Department (HOSPITAL_BASED_OUTPATIENT_CLINIC_OR_DEPARTMENT_OTHER)
Admission: EM | Admit: 2013-07-14 | Discharge: 2013-07-14 | Disposition: A | Discharge status: Home / Self Care | Payer: Medicare Other | Attending: Emergency Medicine | Admitting: Emergency Medicine

## 2013-07-14 ENCOUNTER — Encounter (HOSPITAL_BASED_OUTPATIENT_CLINIC_OR_DEPARTMENT_OTHER): Payer: Self-pay | Admitting: Emergency Medicine

## 2013-07-14 DIAGNOSIS — Z8719 Personal history of other diseases of the digestive system: Secondary | ICD-10-CM | POA: Insufficient documentation

## 2013-07-14 DIAGNOSIS — E119 Type 2 diabetes mellitus without complications: Secondary | ICD-10-CM | POA: Insufficient documentation

## 2013-07-14 DIAGNOSIS — I2510 Atherosclerotic heart disease of native coronary artery without angina pectoris: Secondary | ICD-10-CM | POA: Insufficient documentation

## 2013-07-14 DIAGNOSIS — Z794 Long term (current) use of insulin: Secondary | ICD-10-CM | POA: Insufficient documentation

## 2013-07-14 DIAGNOSIS — J4489 Other specified chronic obstructive pulmonary disease: Secondary | ICD-10-CM | POA: Insufficient documentation

## 2013-07-14 DIAGNOSIS — L03116 Cellulitis of left lower limb: Secondary | ICD-10-CM

## 2013-07-14 DIAGNOSIS — L03119 Cellulitis of unspecified part of limb: Principal | ICD-10-CM

## 2013-07-14 DIAGNOSIS — I10 Essential (primary) hypertension: Secondary | ICD-10-CM | POA: Insufficient documentation

## 2013-07-14 DIAGNOSIS — Z8659 Personal history of other mental and behavioral disorders: Secondary | ICD-10-CM | POA: Insufficient documentation

## 2013-07-14 DIAGNOSIS — L02619 Cutaneous abscess of unspecified foot: Secondary | ICD-10-CM | POA: Insufficient documentation

## 2013-07-14 DIAGNOSIS — G8929 Other chronic pain: Secondary | ICD-10-CM | POA: Insufficient documentation

## 2013-07-14 DIAGNOSIS — L98499 Non-pressure chronic ulcer of skin of other sites with unspecified severity: Secondary | ICD-10-CM | POA: Insufficient documentation

## 2013-07-14 DIAGNOSIS — Z79899 Other long term (current) drug therapy: Secondary | ICD-10-CM | POA: Insufficient documentation

## 2013-07-14 DIAGNOSIS — Z7982 Long term (current) use of aspirin: Secondary | ICD-10-CM | POA: Insufficient documentation

## 2013-07-14 DIAGNOSIS — F17200 Nicotine dependence, unspecified, uncomplicated: Secondary | ICD-10-CM | POA: Insufficient documentation

## 2013-07-14 DIAGNOSIS — E785 Hyperlipidemia, unspecified: Secondary | ICD-10-CM | POA: Insufficient documentation

## 2013-07-14 DIAGNOSIS — Z9861 Coronary angioplasty status: Secondary | ICD-10-CM | POA: Insufficient documentation

## 2013-07-14 DIAGNOSIS — Z7902 Long term (current) use of antithrombotics/antiplatelets: Secondary | ICD-10-CM | POA: Insufficient documentation

## 2013-07-14 DIAGNOSIS — J449 Chronic obstructive pulmonary disease, unspecified: Secondary | ICD-10-CM | POA: Insufficient documentation

## 2013-07-14 LAB — CBC WITH DIFFERENTIAL/PLATELET
Basophils Absolute: 0 10*3/uL (ref 0.0–0.1)
Basophils Relative: 0 % (ref 0–1)
EOS ABS: 0.2 10*3/uL (ref 0.0–0.7)
Eosinophils Relative: 3 % (ref 0–5)
HCT: 43.9 % (ref 39.0–52.0)
HEMOGLOBIN: 14.9 g/dL (ref 13.0–17.0)
LYMPHS ABS: 1.9 10*3/uL (ref 0.7–4.0)
LYMPHS PCT: 27 % (ref 12–46)
MCH: 31.6 pg (ref 26.0–34.0)
MCHC: 33.9 g/dL (ref 30.0–36.0)
MCV: 93.2 fL (ref 78.0–100.0)
MONOS PCT: 9 % (ref 3–12)
Monocytes Absolute: 0.7 10*3/uL (ref 0.1–1.0)
NEUTROS ABS: 4.3 10*3/uL (ref 1.7–7.7)
NEUTROS PCT: 60 % (ref 43–77)
PLATELETS: 180 10*3/uL (ref 150–400)
RBC: 4.71 MIL/uL (ref 4.22–5.81)
RDW: 14 % (ref 11.5–15.5)
WBC: 7.1 10*3/uL (ref 4.0–10.5)

## 2013-07-14 LAB — COMPREHENSIVE METABOLIC PANEL
ALK PHOS: 126 U/L — AB (ref 39–117)
ALT: 27 U/L (ref 0–53)
AST: 19 U/L (ref 0–37)
Albumin: 3.8 g/dL (ref 3.5–5.2)
BILIRUBIN TOTAL: 0.3 mg/dL (ref 0.3–1.2)
BUN: 13 mg/dL (ref 6–23)
CHLORIDE: 97 meq/L (ref 96–112)
CO2: 24 mEq/L (ref 19–32)
Calcium: 9.4 mg/dL (ref 8.4–10.5)
Creatinine, Ser: 0.7 mg/dL (ref 0.50–1.35)
GFR calc non Af Amer: >90 mL/min (ref >90)
GLUCOSE: 350 mg/dL — AB (ref 70–99)
POTASSIUM: 4.4 meq/L (ref 3.7–5.3)
SODIUM: 135 meq/L — AB (ref 137–147)
TOTAL PROTEIN: 6.9 g/dL (ref 6.0–8.3)

## 2013-07-14 MED ORDER — HYDROMORPHONE HCL PF 1 MG/ML IJ SOLN
1.0000 mg | Freq: Once | INTRAMUSCULAR | Status: AC
  Administered 2013-07-14: 1 mg via INTRAVENOUS
  Filled 2013-07-14: qty 1

## 2013-07-14 MED ORDER — SODIUM CHLORIDE 0.9 % IV SOLN
Freq: Once | INTRAVENOUS | Status: AC
  Administered 2013-07-14: 20:00:00 via INTRAVENOUS

## 2013-07-14 MED ORDER — INSULIN REGULAR HUMAN 100 UNIT/ML IJ SOLN
8.0000 [IU] | Freq: Once | INTRAMUSCULAR | Status: AC
  Administered 2013-07-14: 22:00:00 via SUBCUTANEOUS
  Filled 2013-07-14: qty 1

## 2013-07-14 MED ORDER — OXYCODONE-ACETAMINOPHEN 5-325 MG PO TABS: 1.0000 | ORAL_TABLET | ORAL | Status: DC | PRN

## 2013-07-14 MED ORDER — CLINDAMYCIN HCL 150 MG PO CAPS: 300.0000 mg | ORAL_CAPSULE | Freq: Three times a day (TID) | ORAL | Status: DC

## 2013-07-14 MED ORDER — CLINDAMYCIN PHOSPHATE 900 MG/50ML IV SOLN
900.0000 mg | Freq: Once | INTRAVENOUS | Status: AC
  Administered 2013-07-14: 900 mg via INTRAVENOUS
  Filled 2013-07-14: qty 50
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
## 2013-07-14 NOTE — Discharge Instructions (Signed)
Hyperglycemia °Hyperglycemia occurs when the glucose (sugar) in your blood is too high. Hyperglycemia can happen for many reasons, but it most often happens to people who do not know they have diabetes or are not managing their diabetes properly.  °CAUSES  °Whether you have diabetes or not, there are other causes of hyperglycemia. Hyperglycemia can occur when you have diabetes, but it can also occur in other situations that you might not be as aware of, such as: °Diabetes °· If you have diabetes and are having problems controlling your blood glucose, hyperglycemia could occur because of some of the following reasons: °· Not following your meal plan. °· Not taking your diabetes medications or not taking it properly. °· Exercising less or doing less activity than you normally do. °· Being sick. °Pre-diabetes °· This cannot be ignored. Before people develop Type 2 diabetes, they almost always have "pre-diabetes." This is when your blood glucose levels are higher than normal, but not yet high enough to be diagnosed as diabetes. Research has shown that some long-term damage to the body, especially the heart and circulatory system, may already be occurring during pre-diabetes. If you take action to manage your blood glucose when you have pre-diabetes, you may delay or prevent Type 2 diabetes from developing. °Stress °· If you have diabetes, you may be "diet" controlled or on oral medications or insulin to control your diabetes. However, you may find that your blood glucose is higher than usual in the hospital whether you have diabetes or not. This is often referred to as "stress hyperglycemia." Stress can elevate your blood glucose. This happens because of hormones put out by the body during times of stress. If stress has been the cause of your high blood glucose, it can be followed regularly by your caregiver. That way he/she can make sure your hyperglycemia does not continue to get worse or progress to  diabetes. °Steroids °· Steroids are medications that act on the infection fighting system (immune system) to block inflammation or infection. One side effect can be a rise in blood glucose. Most people can produce enough extra insulin to allow for this rise, but for those who cannot, steroids make blood glucose levels go even higher. It is not unusual for steroid treatments to "uncover" diabetes that is developing. It is not always possible to determine if the hyperglycemia will go away after the steroids are stopped. A special blood test called an A1c is sometimes done to determine if your blood glucose was elevated before the steroids were started. °SYMPTOMS °· Thirsty. °· Frequent urination. °· Dry mouth. °· Blurred vision. °· Tired or fatigue. °· Weakness. °· Sleepy. °· Tingling in feet or leg. °DIAGNOSIS  °Diagnosis is made by monitoring blood glucose in one or all of the following ways: °· A1c test. This is a chemical found in your blood. °· Fingerstick blood glucose monitoring. °· Laboratory results. °TREATMENT  °First, knowing the cause of the hyperglycemia is important before the hyperglycemia can be treated. Treatment may include, but is not be limited to: °· Education. °· Change or adjustment in medications. °· Change or adjustment in meal plan. °· Treatment for an illness, infection, etc. °· More frequent blood glucose monitoring. °· Change in exercise plan. °· Decreasing or stopping steroids. °· Lifestyle changes. °HOME CARE INSTRUCTIONS  °· Test your blood glucose as directed. °· Exercise regularly. Your caregiver will give you instructions about exercise. Pre-diabetes or diabetes which comes on with stress is helped by exercising. °· Eat wholesome,   balanced meals. Eat often and at regular, fixed times. Your caregiver or nutritionist will give you a meal plan to guide your sugar intake.  Being at an ideal weight is important. If needed, losing as little as 10 to 15 pounds may help improve blood  glucose levels. SEEK MEDICAL CARE IF:   You have questions about medicine, activity, or diet.  You continue to have symptoms (problems such as increased thirst, urination, or weight gain). SEEK IMMEDIATE MEDICAL CARE IF:   You are vomiting or have diarrhea.  Your breath smells fruity.  You are breathing faster or slower.  You are very sleepy or incoherent.  You have numbness, tingling, or pain in your feet or hands.  You have chest pain.  Your symptoms get worse even though you have been following your caregiver's orders.  If you have any other questions or concerns. Document Released: 09/16/2000 Document Revised: 06/15/2011 Document Reviewed: 07/20/2011 [facility] Patient Information 2014 [place], [place]. Cellulitis Cellulitis is an infection of the skin and the tissue beneath it. The infected area is usually red and tender. Cellulitis occurs most often in the arms and lower legs.  CAUSES  Cellulitis is caused by bacteria that enter the skin through cracks or cuts in the skin. The most common types of bacteria that cause cellulitis are Staphylococcus and Streptococcus. SYMPTOMS   Redness and warmth.  Swelling.  Tenderness or pain.  Fever. DIAGNOSIS  Your caregiver can usually determine what is wrong based on a physical exam. Blood tests may also be done. TREATMENT  Treatment usually involves taking an antibiotic medicine. HOME CARE INSTRUCTIONS   Take your antibiotics as directed. Finish them even if you start to feel better.  Keep the infected arm or leg elevated to reduce swelling.  Apply a warm cloth to the affected area up to 4 times per day to relieve pain.  Only take over-the-counter or prescription medicines for pain, discomfort, or fever as directed by your caregiver.  Keep all follow-up appointments as directed by your caregiver. SEEK MEDICAL CARE IF:   You notice red streaks coming from the infected area.  Your red area gets larger or turns dark in  color.  Your bone or joint underneath the infected area becomes painful after the skin has healed.  Your infection returns in the same area or another area.  You notice a swollen bump in the infected area.  You develop new symptoms. SEEK IMMEDIATE MEDICAL CARE IF:   You have a fever.  You feel very sleepy.  You develop vomiting or diarrhea.  You have a general ill feeling (malaise) with muscle aches and pains. MAKE SURE YOU:   Understand these instructions.  Will watch your condition.  Will get help right away if you are not doing well or get worse. Document Released: 12/31/2004 Document Revised: 09/22/2011 Document Reviewed: 06/08/2011 [facility] Patient Information 2014 [employer], [place].

## 2013-07-14 NOTE — ED Notes (Signed)
Pt states that he forgot to tell us that he was recently put on home oxygen 2l continously at home and he has portable tanks, but forgot to bring them with him

## 2013-07-14 NOTE — ED Provider Notes (Signed)
Medical screening examination/treatment/procedure(s) were performed by non-physician practitioner and as supervising physician I was immediately available for consultation/collaboration.   Celene KrasJon R Demetrick Eichenberger, MD 07/14/13 2214

## 2013-07-14 NOTE — ED Notes (Signed)
States he was sitting on the porch yesterday afternoon and started having itching between his left 2nd and 3rd toes. Today he has a rash on the top of his foot.

## 2013-08-20 ENCOUNTER — Emergency Department (HOSPITAL_BASED_OUTPATIENT_CLINIC_OR_DEPARTMENT_OTHER)
Admission: EM | Admit: 2013-08-20 | Discharge: 2013-08-20 | Disposition: A | Discharge status: Home / Self Care | Payer: Medicare Other | Attending: Emergency Medicine | Admitting: Emergency Medicine

## 2013-08-20 ENCOUNTER — Emergency Department (HOSPITAL_BASED_OUTPATIENT_CLINIC_OR_DEPARTMENT_OTHER): Payer: Medicare Other

## 2013-08-20 ENCOUNTER — Encounter (HOSPITAL_BASED_OUTPATIENT_CLINIC_OR_DEPARTMENT_OTHER): Payer: Self-pay | Admitting: Emergency Medicine

## 2013-08-20 DIAGNOSIS — I2510 Atherosclerotic heart disease of native coronary artery without angina pectoris: Secondary | ICD-10-CM | POA: Insufficient documentation

## 2013-08-20 DIAGNOSIS — E119 Type 2 diabetes mellitus without complications: Secondary | ICD-10-CM | POA: Insufficient documentation

## 2013-08-20 DIAGNOSIS — Z9889 Other specified postprocedural states: Secondary | ICD-10-CM | POA: Insufficient documentation

## 2013-08-20 DIAGNOSIS — Z9861 Coronary angioplasty status: Secondary | ICD-10-CM | POA: Insufficient documentation

## 2013-08-20 DIAGNOSIS — Z8719 Personal history of other diseases of the digestive system: Secondary | ICD-10-CM | POA: Insufficient documentation

## 2013-08-20 DIAGNOSIS — Y929 Unspecified place or not applicable: Secondary | ICD-10-CM | POA: Insufficient documentation

## 2013-08-20 DIAGNOSIS — W278XXA Contact with other nonpowered hand tool, initial encounter: Secondary | ICD-10-CM | POA: Insufficient documentation

## 2013-08-20 DIAGNOSIS — Z7902 Long term (current) use of antithrombotics/antiplatelets: Secondary | ICD-10-CM | POA: Insufficient documentation

## 2013-08-20 DIAGNOSIS — F3289 Other specified depressive episodes: Secondary | ICD-10-CM | POA: Insufficient documentation

## 2013-08-20 DIAGNOSIS — F17200 Nicotine dependence, unspecified, uncomplicated: Secondary | ICD-10-CM | POA: Insufficient documentation

## 2013-08-20 DIAGNOSIS — F329 Major depressive disorder, single episode, unspecified: Secondary | ICD-10-CM | POA: Insufficient documentation

## 2013-08-20 DIAGNOSIS — Z792 Long term (current) use of antibiotics: Secondary | ICD-10-CM | POA: Insufficient documentation

## 2013-08-20 DIAGNOSIS — Z7982 Long term (current) use of aspirin: Secondary | ICD-10-CM | POA: Insufficient documentation

## 2013-08-20 DIAGNOSIS — J449 Chronic obstructive pulmonary disease, unspecified: Secondary | ICD-10-CM | POA: Insufficient documentation

## 2013-08-20 DIAGNOSIS — Y9389 Activity, other specified: Secondary | ICD-10-CM | POA: Insufficient documentation

## 2013-08-20 DIAGNOSIS — I10 Essential (primary) hypertension: Secondary | ICD-10-CM | POA: Insufficient documentation

## 2013-08-20 DIAGNOSIS — G8929 Other chronic pain: Secondary | ICD-10-CM | POA: Insufficient documentation

## 2013-08-20 DIAGNOSIS — Z79899 Other long term (current) drug therapy: Secondary | ICD-10-CM | POA: Insufficient documentation

## 2013-08-20 DIAGNOSIS — Z794 Long term (current) use of insulin: Secondary | ICD-10-CM | POA: Insufficient documentation

## 2013-08-20 DIAGNOSIS — E785 Hyperlipidemia, unspecified: Secondary | ICD-10-CM | POA: Insufficient documentation

## 2013-08-20 DIAGNOSIS — S61409A Unspecified open wound of unspecified hand, initial encounter: Secondary | ICD-10-CM | POA: Insufficient documentation

## 2013-08-20 DIAGNOSIS — S61419A Laceration without foreign body of unspecified hand, initial encounter: Secondary | ICD-10-CM

## 2013-08-20 DIAGNOSIS — J4489 Other specified chronic obstructive pulmonary disease: Secondary | ICD-10-CM | POA: Insufficient documentation

## 2013-08-20 MED ORDER — OXYCODONE-ACETAMINOPHEN 5-325 MG PO TABS
1.0000 | ORAL_TABLET | Freq: Once | ORAL | Status: AC
  Administered 2013-08-20: 1 via ORAL
  Filled 2013-08-20: qty 1

## 2013-08-20 NOTE — ED Notes (Signed)
D/c home with ride 
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
## 2013-08-20 NOTE — Discharge Instructions (Signed)
Wound Care Wound care helps prevent pain and infection.  You may need a tetanus shot if:  You cannot remember when you had your last tetanus shot.  You have never had a tetanus shot.  The injury broke your skin. If you need a tetanus shot and you choose not to have one, you may get tetanus. Sickness from tetanus can be serious. HOME CARE   Only take medicine as told by your doctor.  Clean the wound daily with mild soap and water.  Change any bandages (dressings) as told by your doctor.  Put medicated cream and a bandage on the wound as told by your doctor.  Change the bandage if it gets wet, dirty, or starts to smell.  Take showers. Do not take baths, swim, or do anything that puts your wound under water.  Rest and raise (elevate) the wound until the pain and puffiness (swelling) are better.  Keep all doctor visits as told. GET HELP RIGHT AWAY IF:   Yellowish-white fluid (pus) comes from the wound.  Medicine does not lessen your pain.  There is a red streak going away from the wound.  You have a fever. MAKE SURE YOU:   Understand these instructions.  Will watch your condition.  Will get help right away if you are not doing well or get worse. Document Released: 12/31/2007 Document Revised: 06/15/2011 Document Reviewed: 07/27/2010 ExitCare Patient Information 2014 [employer].  

## 2013-08-20 NOTE — ED Notes (Signed)
Pt states a saw fell off the table and hit his hand. Pt has bandage to left hand with bleeding controlled at this time. Pt has two abrasions to the left back part of hand with swelling. CNS intact cap refill less than 3

## 2013-08-22 NOTE — ED Provider Notes (Signed)
Medical screening examination/treatment/procedure(s) were performed by non-physician practitioner and as supervising physician I was immediately available for consultation/collaboration.   EKG Interpretation None       Hurman HornJohn M Asuncion Tapscott, MD 08/22/13 [id number]

## 2013-10-16 ENCOUNTER — Ambulatory Visit (INDEPENDENT_AMBULATORY_CARE_PROVIDER_SITE_OTHER): Payer: Medicare Other | Admitting: Nurse Practitioner

## 2013-10-16 ENCOUNTER — Encounter: Payer: Self-pay | Admitting: Nurse Practitioner

## 2013-10-16 VITALS — BP 120/82 | HR 80 | Ht 65.0 in | Wt 226.1 lb

## 2013-10-16 DIAGNOSIS — R0600 Dyspnea, unspecified: Secondary | ICD-10-CM

## 2013-10-16 DIAGNOSIS — R0989 Other specified symptoms and signs involving the circulatory and respiratory systems: Secondary | ICD-10-CM

## 2013-10-16 DIAGNOSIS — F17200 Nicotine dependence, unspecified, uncomplicated: Secondary | ICD-10-CM

## 2013-10-16 DIAGNOSIS — E785 Hyperlipidemia, unspecified: Secondary | ICD-10-CM

## 2013-10-16 DIAGNOSIS — R0609 Other forms of dyspnea: Secondary | ICD-10-CM

## 2013-10-16 DIAGNOSIS — I2510 Atherosclerotic heart disease of native coronary artery without angina pectoris: Secondary | ICD-10-CM

## 2013-10-16 DIAGNOSIS — I10 Essential (primary) hypertension: Secondary | ICD-10-CM

## 2013-10-16 LAB — CBC
HCT: 45.7 % (ref 39.0–52.0)
Hemoglobin: 15 g/dL (ref 13.0–17.0)
MCHC: 32.9 g/dL (ref 30.0–36.0)
MCV: 92 fl (ref 78.0–100.0)
Platelets: 263 10*3/uL (ref 150.0–400.0)
RBC: 4.97 Mil/uL (ref 4.22–5.81)
RDW: 14.5 % (ref 11.5–15.5)
WBC: 11.9 10*3/uL — ABNORMAL HIGH (ref 4.0–10.5)

## 2013-10-16 NOTE — Patient Instructions (Addendum)
Needs release for medical records from [facility] for recent admission  We will check labs today  Stay on your current medicines for now  Keep restricting your salt  Try to not smoke again  See [facility] Jens [facility]shaw at the [facility] - try to establish  I will need to obtain your records and see what was done to you and what we need to do.   Call the [facility] office at [phone] if you have any questions, problems or concerns.
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
## 2013-10-17 ENCOUNTER — Other Ambulatory Visit: Payer: Self-pay

## 2013-10-17 LAB — BASIC METABOLIC PANEL
BUN: 11 mg/dL (ref 6–23)
CO2: 24 mEq/L (ref 19–32)
Calcium: 9.3 mg/dL (ref 8.4–10.5)
Chloride: 103 mEq/L (ref 96–112)
Creatinine, Ser: 0.6 mg/dL (ref 0.4–1.5)
GFR: 150.59 mL/min (ref >60.00)
Glucose, Bld: 173 mg/dL — ABNORMAL HIGH (ref 70–99)
Potassium: 4 mEq/L (ref 3.5–5.1)
Sodium: 138 mEq/L (ref 135–145)

## 2013-10-17 LAB — BRAIN NATRIURETIC PEPTIDE: Pro B Natriuretic peptide (BNP): 42 pg/mL (ref 0.0–100.0)

## 2013-10-17 MED ORDER — NEBIVOLOL HCL 2.5 MG PO TABS: 2.5000 mg | ORAL_TABLET | Freq: Every day | ORAL | Status: DC

## 2013-10-20 ENCOUNTER — Telehealth: Payer: Self-pay | Admitting: Nurse Practitioner

## 2013-10-20 NOTE — Telephone Encounter (Signed)
[facility] (incoming records)  47 pages taken to Daphene Calamityanielle G for [employer]ori G on 10-20-13:djc

## 2013-10-23 ENCOUNTER — Telehealth: Payer: Self-pay | Admitting: *Deleted

## 2013-10-23 NOTE — Telephone Encounter (Signed)
Message copied by Debbe BalesGONZALEZ, DANIELLE R on Mon Oct 23, 2013 12:50 PM ------      Message from: Rosalio MacadamiaGERHARDT, LORI C      Created: Fri Oct 20, 2013  4:00 PM       I received his records - he had a basically normal echo. That admission [facility] looks more like a COPD exacerbation. I do not think he needs further testing from our stanpoint.             lori ------

## 2013-10-23 NOTE — Telephone Encounter (Signed)
S/w pt about medical records and advised that echo was normal and to keep f/u with Dr. Jens Somrenshaw

## 2013-11-22 ENCOUNTER — Encounter: Payer: Self-pay | Admitting: Cardiology

## 2013-11-22 ENCOUNTER — Ambulatory Visit (INDEPENDENT_AMBULATORY_CARE_PROVIDER_SITE_OTHER): Payer: Medicare Other | Admitting: Cardiology

## 2013-11-22 VITALS — BP 126/88 | HR 79 | Ht 65.0 in | Wt 216.0 lb

## 2013-11-22 DIAGNOSIS — I2510 Atherosclerotic heart disease of native coronary artery without angina pectoris: Secondary | ICD-10-CM

## 2013-11-22 DIAGNOSIS — F17200 Nicotine dependence, unspecified, uncomplicated: Secondary | ICD-10-CM

## 2013-11-22 DIAGNOSIS — I10 Essential (primary) hypertension: Secondary | ICD-10-CM

## 2013-11-22 DIAGNOSIS — J449 Chronic obstructive pulmonary disease, unspecified: Secondary | ICD-10-CM

## 2013-11-22 DIAGNOSIS — E785 Hyperlipidemia, unspecified: Secondary | ICD-10-CM

## 2013-11-22 NOTE — Assessment & Plan Note (Signed)
Continue aspirin and statin. Discontinue Plavix. 

## 2013-11-22 NOTE — Assessment & Plan Note (Signed)
Continue statin. 

## 2013-11-22 NOTE — Assessment & Plan Note (Signed)
Blood pressure controlled. Continue present medications. 

## 2013-11-22 NOTE — Patient Instructions (Signed)
Your physician wants you to follow-up in: 6 MONTHS WITH DR CRENSHAW You will receive a reminder letter in the mail two months in advance. If you don't receive a letter, please call our office to schedule the follow-up appointment.   STOP PLAVIX   

## 2013-11-22 NOTE — Assessment & Plan Note (Signed)
Management per primary care. 
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
## 2013-11-22 NOTE — Assessment & Plan Note (Signed)
Patient counseled on avoiding. 

## 2014-03-06 DEATH — deceased

## 2014-08-01 IMAGING — CT CT ABD-PELV W/ CM
2 of 5 series · 16 of 46 positions shown, 18 images · IV contrast (APPLIED)
Comparison: 12/12/2012

CLINICAL DATA: Lower abdominal pain

EXAM:
CT ABDOMEN AND PELVIS WITH CONTRAST
TECHNIQUE: Multidetector CT imaging of the abdomen and pelvis was performed
using the standard protocol following bolus administration of
intravenous contrast.
CONTRAST:  100 mL Omnipaque 300 IV

[Series 2: abd/pelvis 5.0 b31f · axial · 0.79mm/px · z∈[+771,+1196]mm · 13 of 97 slices shown, 15 images]
[im 6/97  soft-tissue]
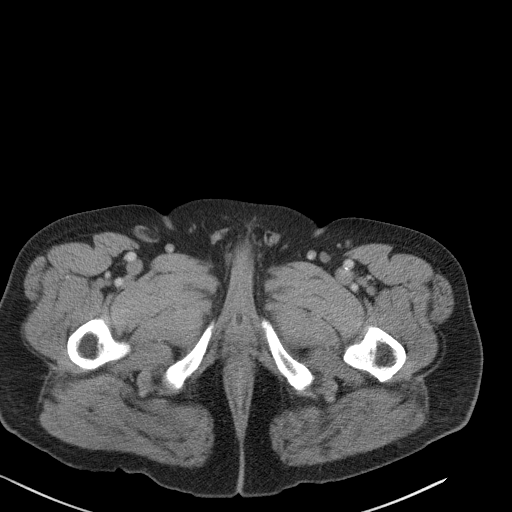
[im 6/97  bone]
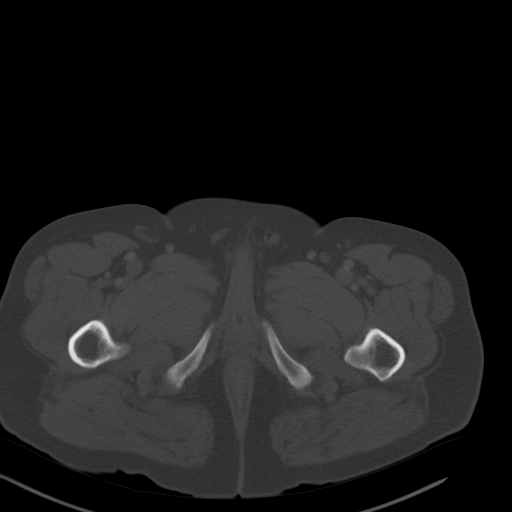
[im 16/97  soft-tissue]
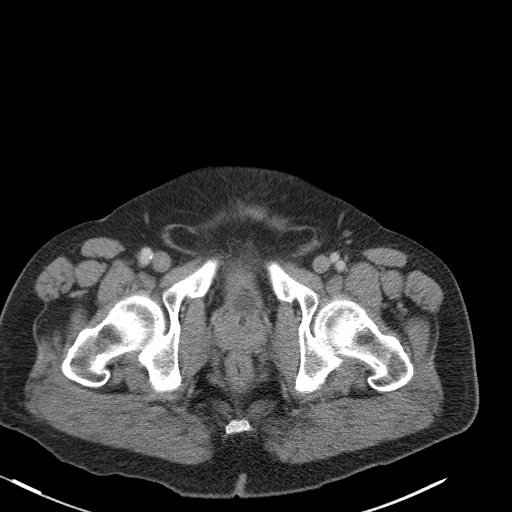
[im 21/97  soft-tissue]
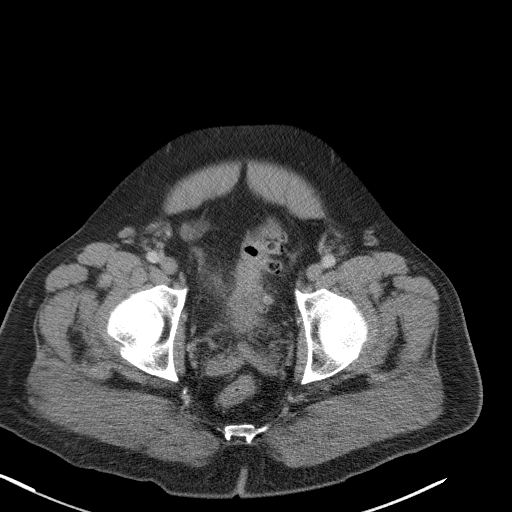
[im 26/97  soft-tissue]
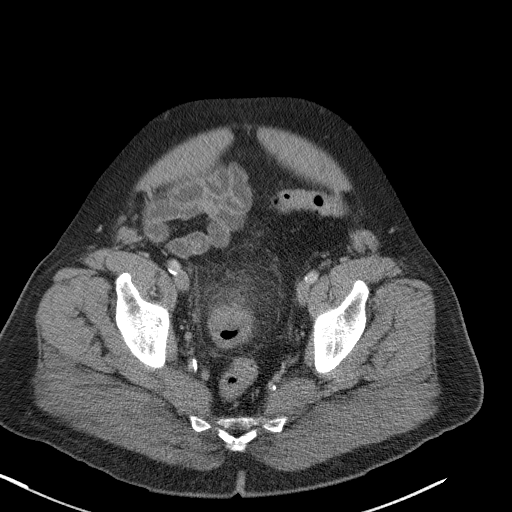
[im 36/97  soft-tissue]
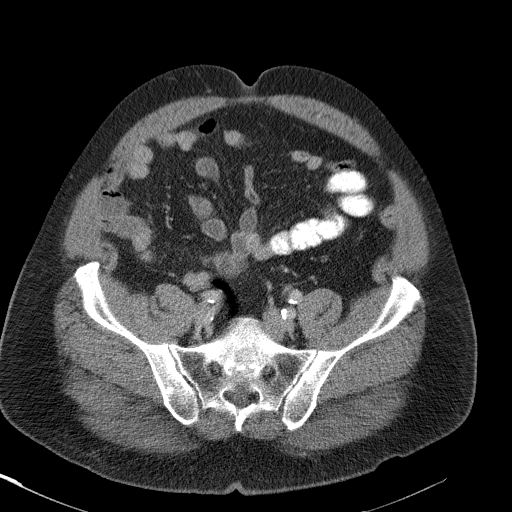
[im 41/97  soft-tissue]
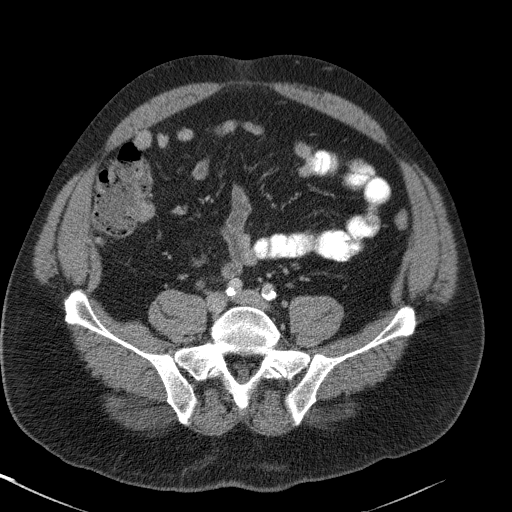
[im 51/97  soft-tissue]
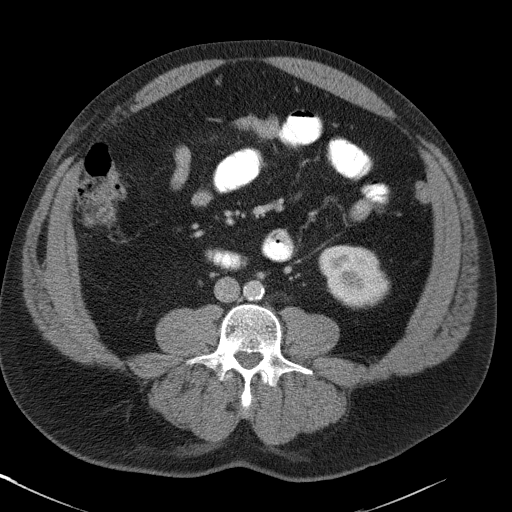
[im 56/97  soft-tissue]
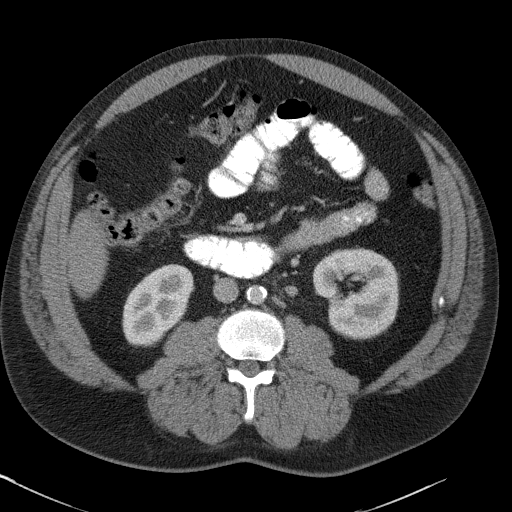
[im 61/97  soft-tissue]
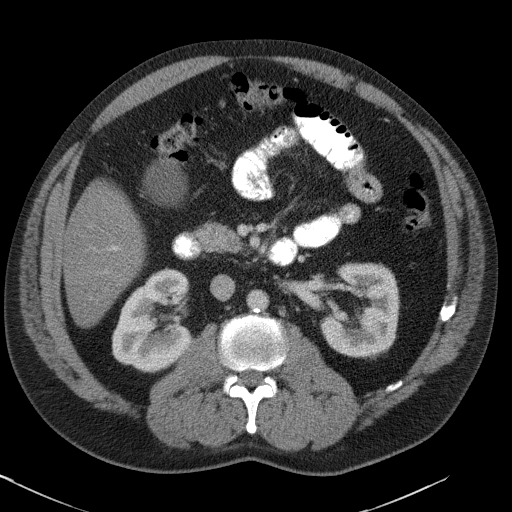
[im 61/97  bone]
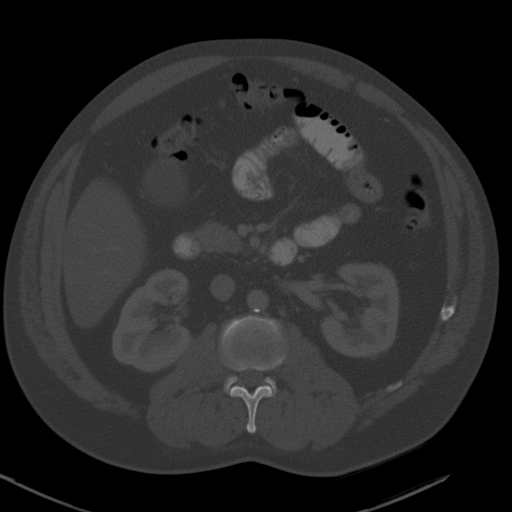
[im 71/97  soft-tissue]
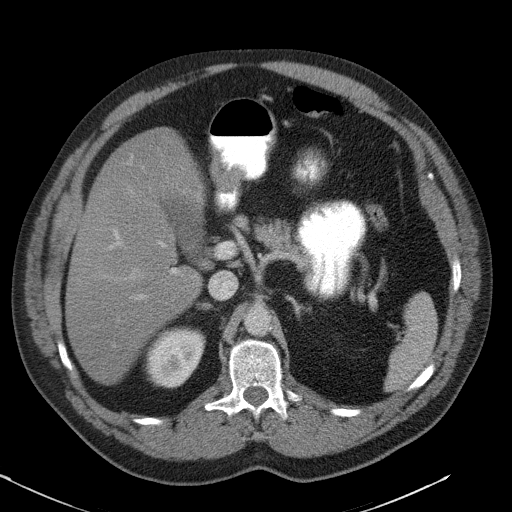
[im 76/97  soft-tissue]
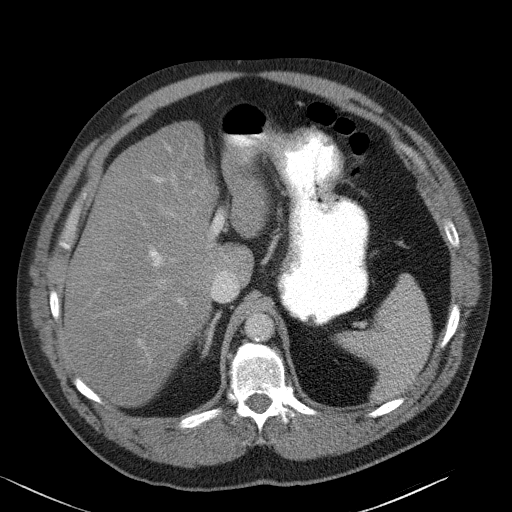
[im 81/97  soft-tissue]
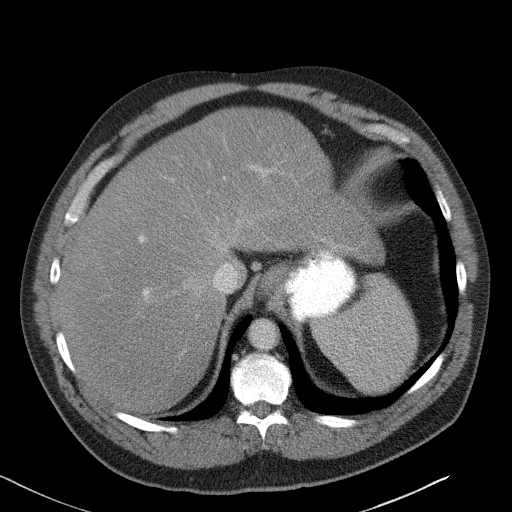
[im 91/97  soft-tissue]
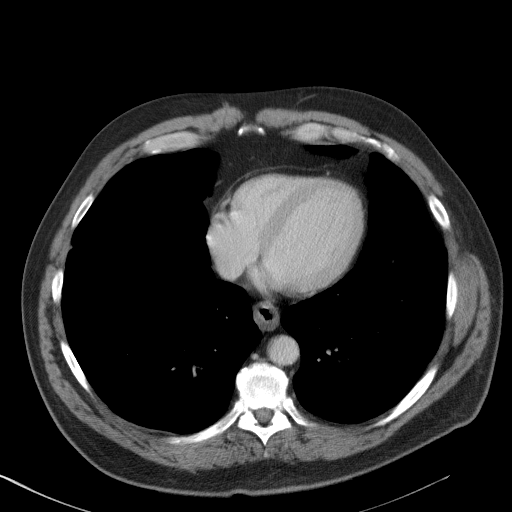

[Series 5: abd/pelvis 3.0 coronal · coronal · 0.79mm/px · 3 of 121 slices shown]
[im 41/121  soft-tissue]
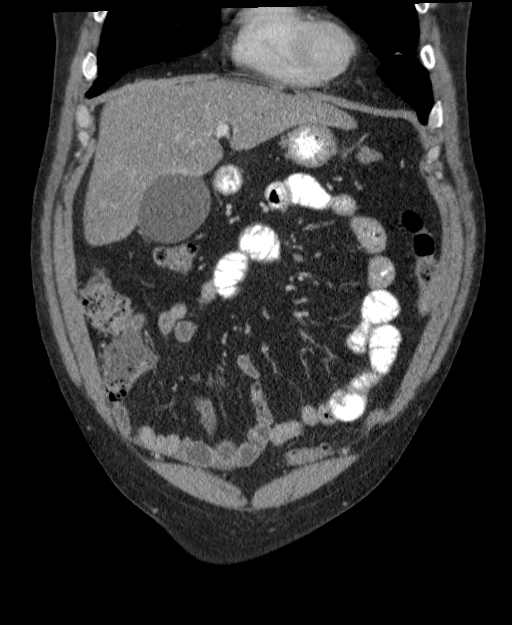
[im 54/121  soft-tissue]
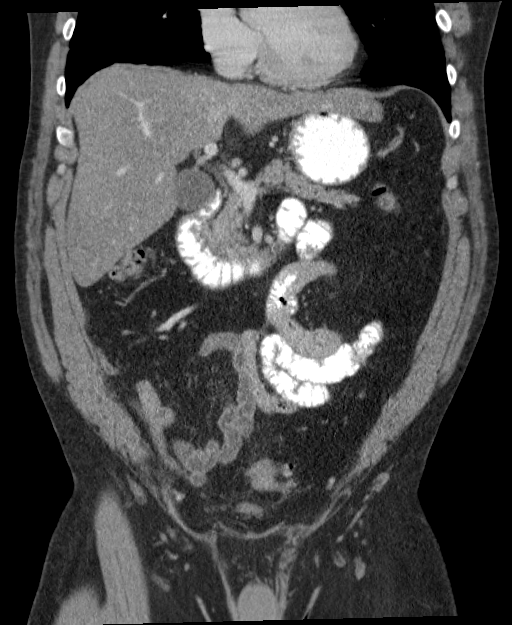
[im 67/121  soft-tissue]
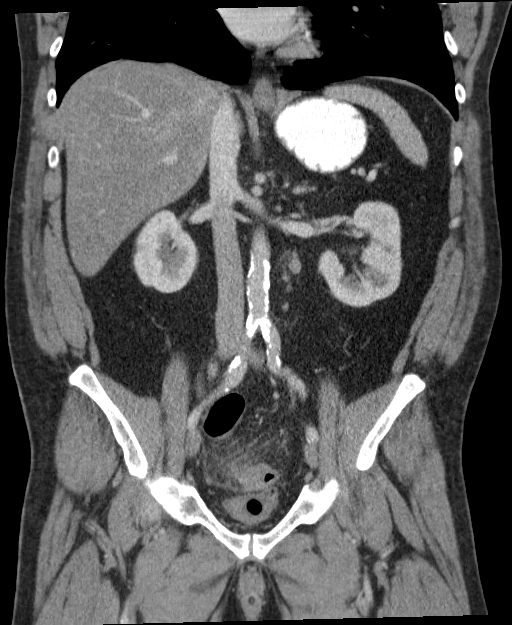

[16 of 46 positions shown; findings below may reference images not displayed]

FINDINGS: Lung bases are clear.

Hepatic steatosis with focal fatty sparing. Calcified splenic
granuloma.

Spleen, pancreas, and adrenal glands are within normal limits.

Gallbladder is unremarkable. No intrahepatic or extrahepatic ductal
dilatation.

8 mm left lower pole renal cyst (series 7/ image 25). Right kidney
is within normal limits. No hydronephrosis.

No evidence of bowel obstruction. Normal appendix. Extensive sigmoid
diverticulosis with wall thickening/inflammatory changes involving
the sigmoid colon, suggesting sigmoid diverticulitis.

Trace pericolonic fluid (series 2/ image 70 for close). No drainable
fluid collection/ abscess. No free air suggest macroscopic
perforation.

Atherosclerotic calcifications of the abdominal aorta and branch
vessels.

Small upper abdominal/retroperitoneal lymph nodes which do not meet
pathologic CT size criteria.

Prostate is unremarkable.

Bladder is decompressed by an indwelling Foley catheter.

Mild degenerative changes of the visualized thoracolumbar spine.
IMPRESSION: Sigmoid diverticulitis.

No drainable fluid collection/abscess.  No free air.

Hepatic steatosis.

## 2015-02-19 IMAGING — CR DG HAND COMPLETE 3+V*L*
3 series · 3 of 3 positions shown · non-contrast
Comparison: None.

CLINICAL DATA: Left hand cut by saw.

EXAM:
LEFT HAND - COMPLETE 3+ VIEW

[x hand pa left]
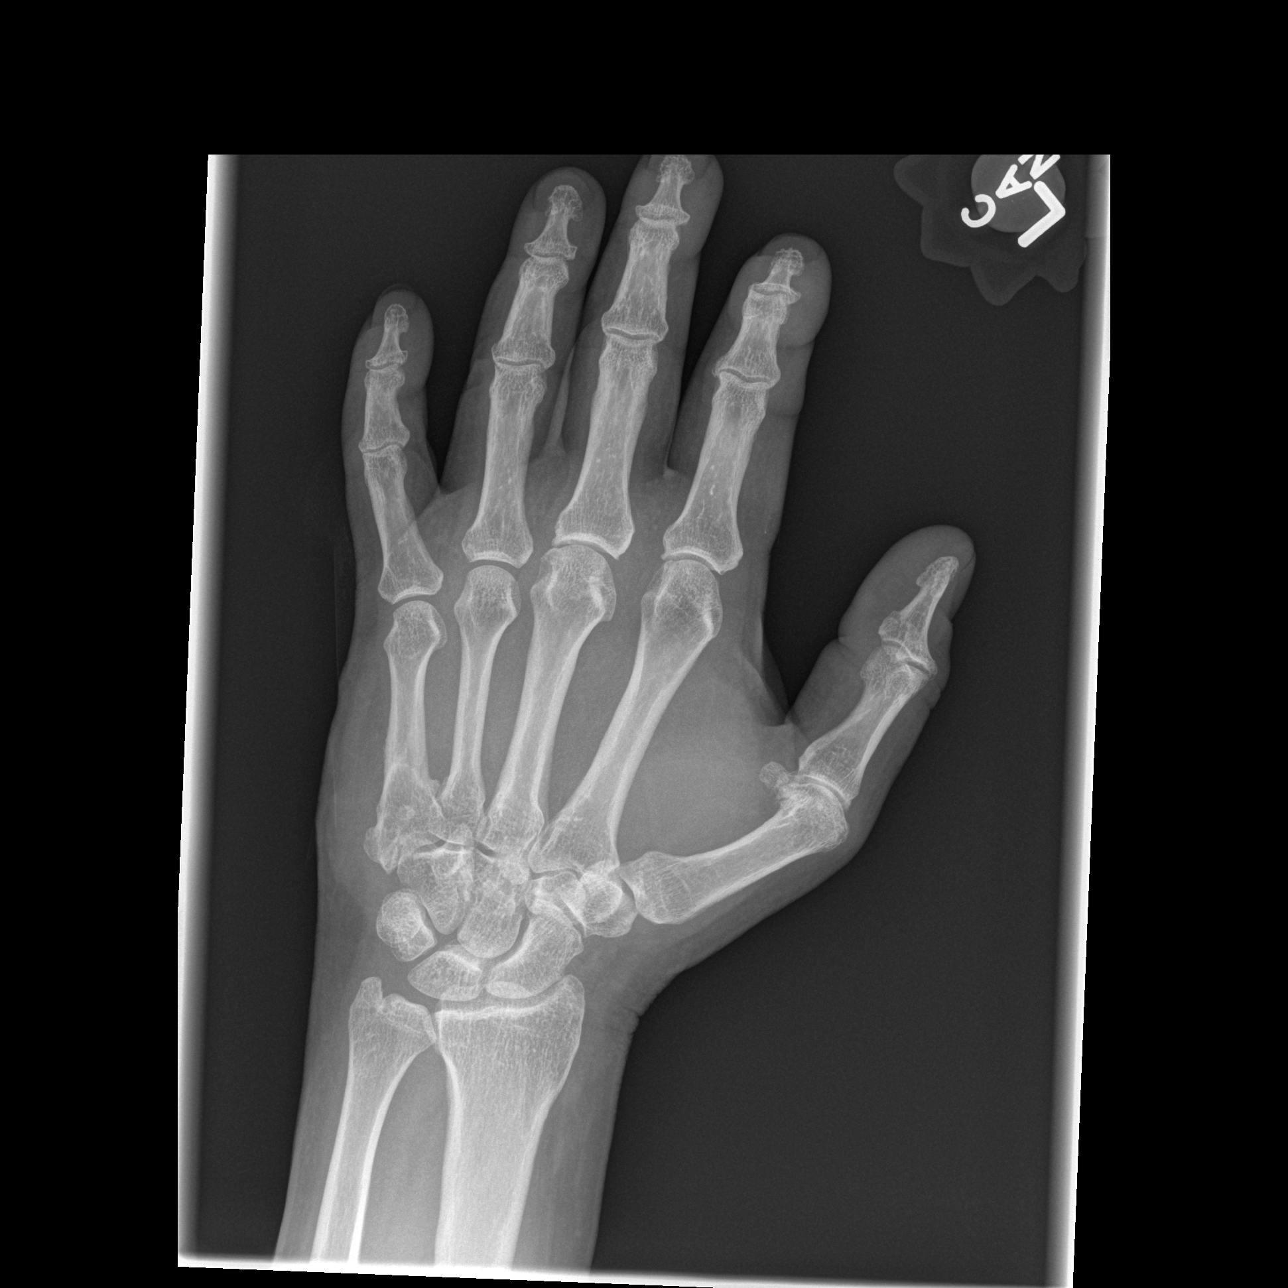

[x hand oblique left]
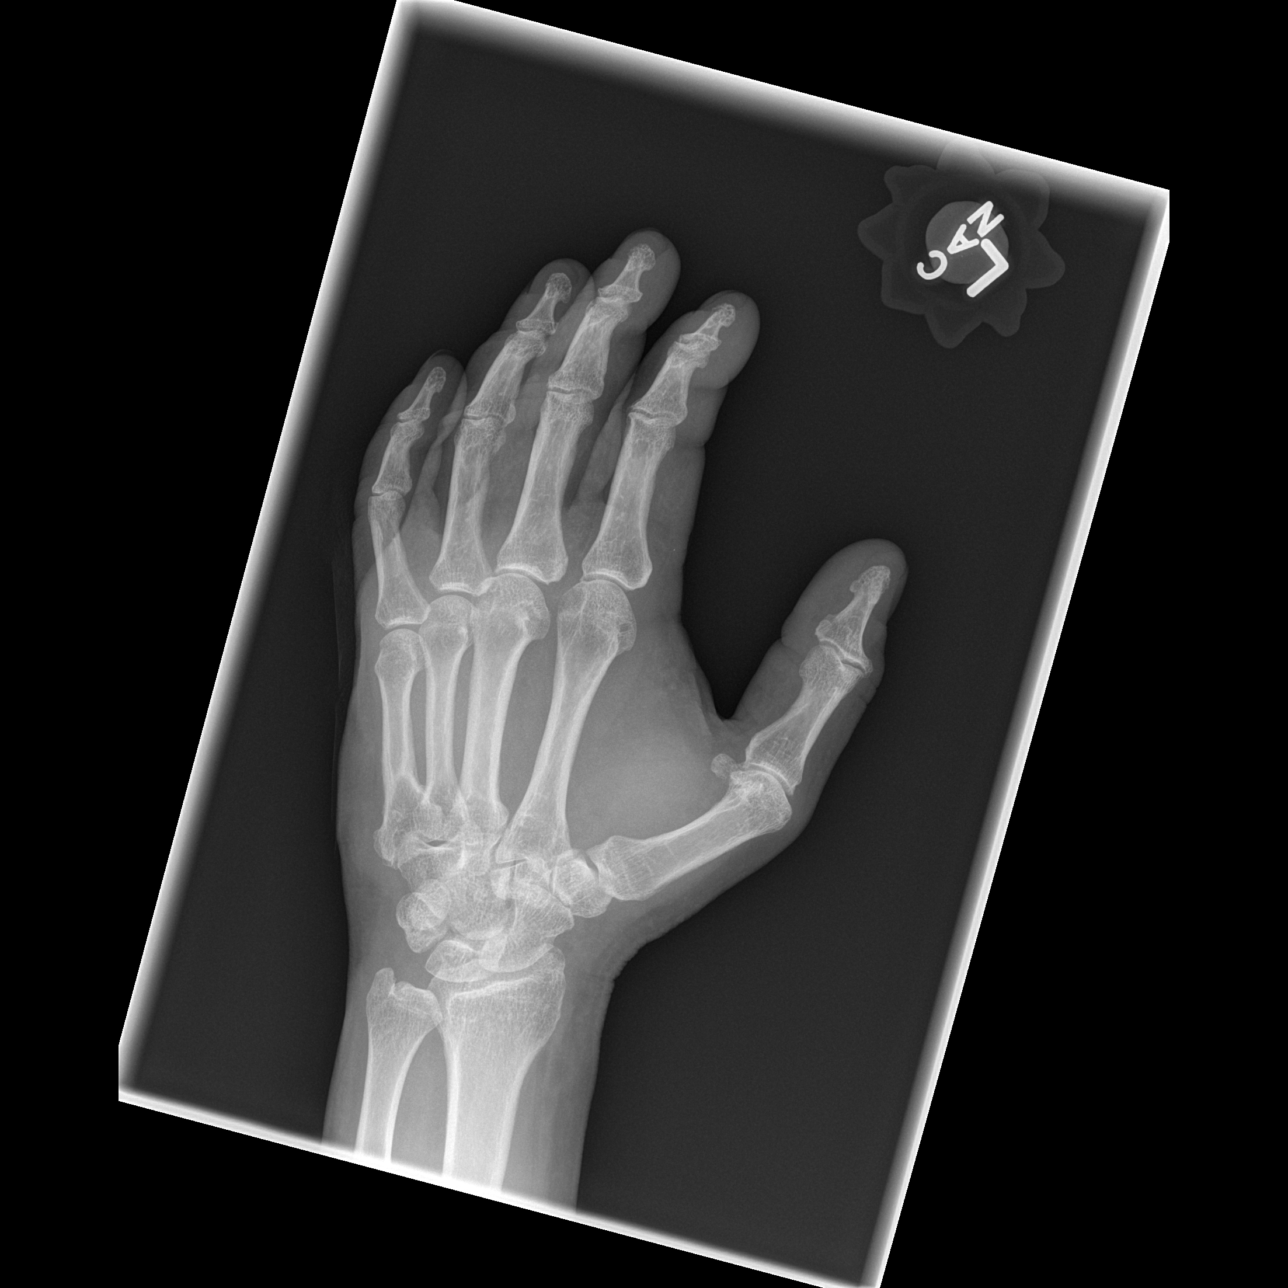

[x hand lat left]
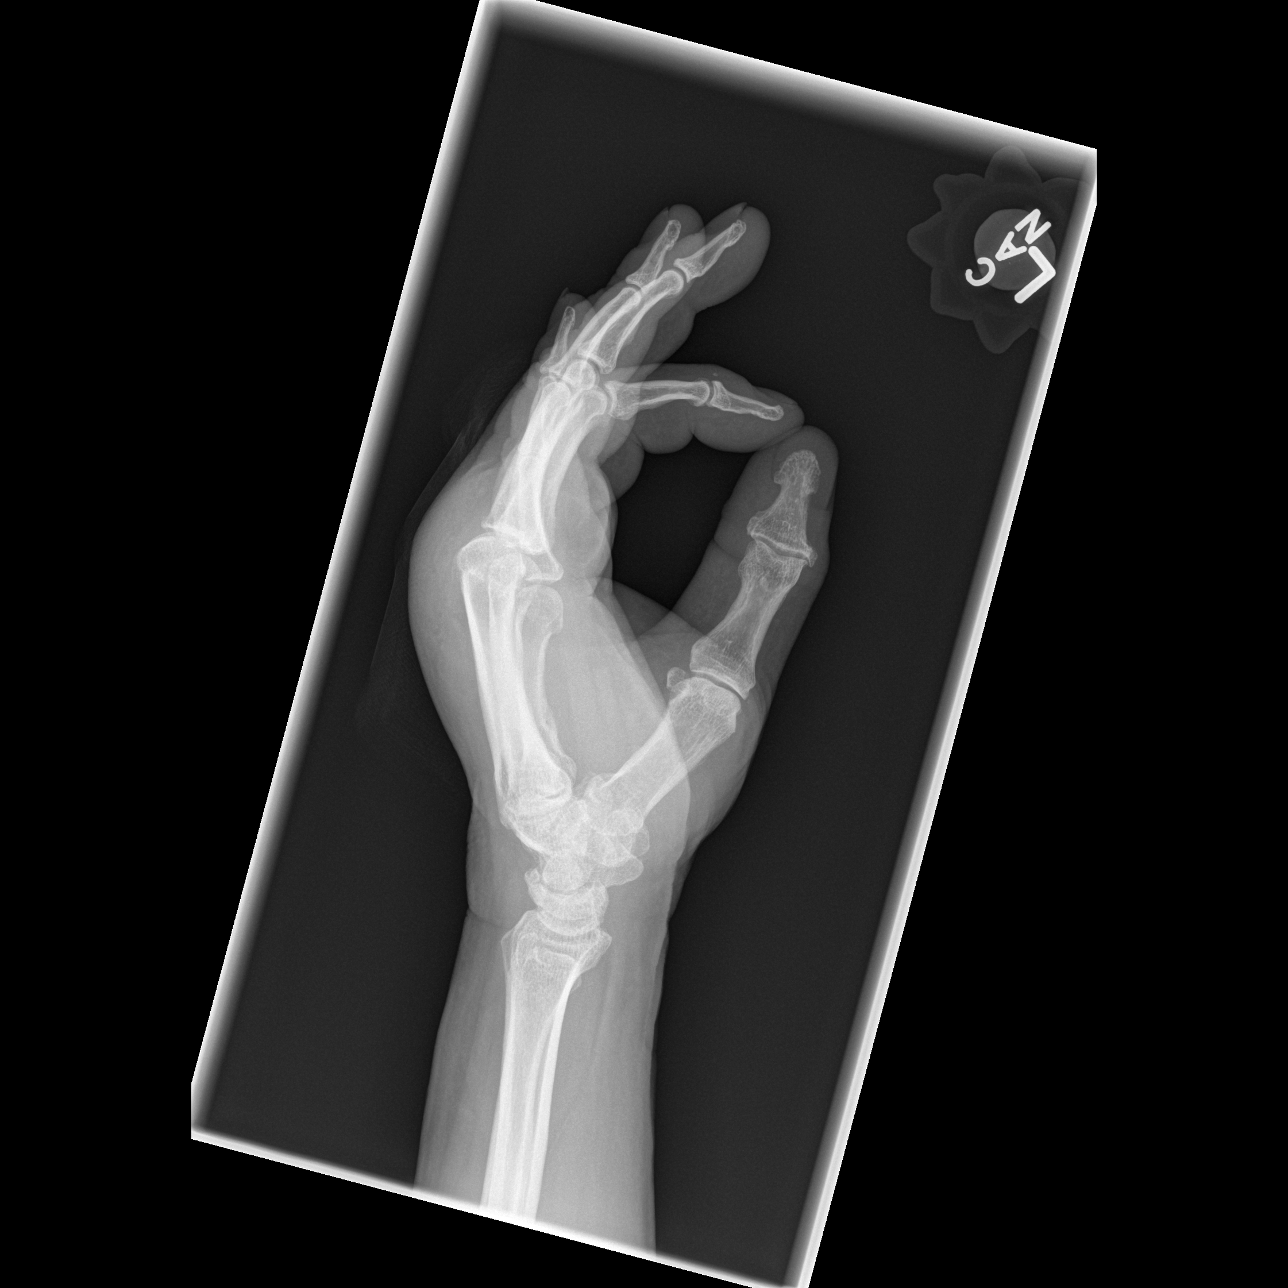

[3 of 3 positions shown; findings below may reference images not displayed]

FINDINGS: There is no evidence of fracture or dislocation. There is soft
tissue swelling in the dorsal hand. There is no radiopaque foreign
body.
IMPRESSION: No acute fracture or dislocation. Soft tissue swelling. No
radiopaque foreign body.
